# Patient Record
Sex: Female | Born: 2000 | Race: White | Hispanic: No | Marital: Single | Smoking: Never smoker
Health system: Southern US, Community
[De-identification: ages and names within clinical notes are randomized; demographics above are authoritative.]

## PROBLEM LIST (undated history)

## (undated) DIAGNOSIS — H9191 Unspecified hearing loss, right ear: Secondary | ICD-10-CM

## (undated) DIAGNOSIS — K0889 Other specified disorders of teeth and supporting structures: Secondary | ICD-10-CM

## (undated) DIAGNOSIS — F419 Anxiety disorder, unspecified: Secondary | ICD-10-CM

## (undated) DIAGNOSIS — H7290 Unspecified perforation of tympanic membrane, unspecified ear: Secondary | ICD-10-CM

## (undated) DIAGNOSIS — F32A Depression, unspecified: Secondary | ICD-10-CM

## (undated) HISTORY — DX: Anxiety disorder, unspecified: F41.9

## (undated) HISTORY — PX: TYMPANOSTOMY TUBE PLACEMENT: SHX32

## (undated) HISTORY — DX: Depression, unspecified: F32.A

---
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
## 2020-01-10 ENCOUNTER — Encounter: Payer: Self-pay | Admitting: Family Medicine

## 2020-01-10 ENCOUNTER — Ambulatory Visit (INDEPENDENT_AMBULATORY_CARE_PROVIDER_SITE_OTHER): Payer: 59 | Admitting: Family Medicine

## 2020-01-10 ENCOUNTER — Other Ambulatory Visit: Payer: Self-pay

## 2020-01-10 VITALS — BP 110/66 | HR 64 | Temp 98.3°F | Ht 64.0 in | Wt 174.6 lb

## 2020-01-10 DIAGNOSIS — H9203 Otalgia, bilateral: Secondary | ICD-10-CM

## 2020-01-10 DIAGNOSIS — F321 Major depressive disorder, single episode, moderate: Secondary | ICD-10-CM | POA: Diagnosis not present

## 2020-01-10 NOTE — Patient Instructions (Signed)
-  ears look fine. No infection/rupture. Would start flonase over the counter daily for a few weeks to see if it helps and then try to equalize pressure in your ears a few times a day.   Let me know if not getting better.   i'll email/call you when letter ready for your cat!   Good luck in school!  D.r Rogers Blocker

## 2020-01-10 NOTE — Progress Notes (Signed)
Patient: Margaret Snow MRN: [id number] DOB: 12-04-2000 PCP: Orma Flaming, MD     Subjective:  Chief Complaint  Patient presents with  . Ear Fullness    [name] ear has been stopped up for 2 weeks, since diving into the pool.    HPI: The patient is a 19 y.o. female who presents today for a ear fullness x 2 weeks. She says that she dived into the pool and her ears hurt really bad after she dove into the pool and since then her hearing has been stopped up. She states she has had decreased hearing since that time, but this has improved. Pain has dramatically improved. She has not used anything over the counter. Her left ear is worse than her right. Denies any tinnitus, drainage.    She also would like a letter for her cat as an emotional support animal at school. Moving into an apartment by herself and is worried if she doesn't have her cat she will get more depressed/lonely. Does comfort her.   Review of Systems  Constitutional: Negative for chills and fever.  HENT: Positive for ear pain. Negative for facial swelling, sore throat and tinnitus.   Neurological: Negative for dizziness, light-headedness and headaches.    Allergies Patient has No Known Allergies.  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  Surgical History Patient  has a past surgical history that includes Tympanostomy tube placement (2004); Tympanoplasty (02/13/2012); and Tympanostomy tube placement.  Family History Pateint's family history includes ADD / ADHD in her sister; Alcohol abuse in her paternal grandfather; Anxiety disorder in her maternal uncle; Cancer in her maternal grandfather and paternal grandfather; Depression in her father and maternal uncle; Headache in her maternal grandfather; Hyperlipidemia in her maternal grandfather; Hypertension in her maternal grandfather, maternal grandmother, and paternal  grandfather; Migraines in her maternal uncle.  Social History Patient  reports that she has never smoked. She has never used smokeless tobacco. She reports that she does not drink alcohol and does not use drugs.    Objective: Vitals:   01/10/20 1006  BP: 110/66  Pulse: 64  Temp: 98.3 F (36.8 C)  TempSrc: Temporal  SpO2: 99%  Weight: 174 lb 9.6 oz (79.2 kg)  Height: 5\' 4"  (1.626 m)    Body mass index is 29.97 kg/m.  Physical Exam Vitals reviewed.  Constitutional:      Appearance: Normal appearance. She is obese.  HENT:     Head: Normocephalic and atraumatic.     Right Ear: Tympanic membrane, ear canal and external ear normal.     Left Ear: Tympanic membrane, ear canal and external ear normal.     Ears:     Comments: Minimal clear air fluid bubbles bilaterally     Nose: Nose normal.     Mouth/Throat:     Mouth: Mucous membranes are moist.  Eyes:     Extraocular Movements: Extraocular movements intact.     Pupils: Pupils are equal, round, and reactive to light.  Pulmonary:     Effort: Pulmonary effort is normal.     Breath sounds: Normal breath sounds.  Neurological:     General: No focal deficit present.     Mental Status: She is alert and oriented to person, place, and time.  Psychiatric:        Mood and Affect: Mood normal.        Behavior: Behavior normal.  Assessment/plan: 1. Ear pain, bilateral Ears with normal exam and symptoms have been improving.  Reassurance given. Recommended otc flonase daily and try to equalize pressure in ears a few times a day, but no infection or abnormality seen on exam. Let me know if not getting better.   2. Depression, major, single episode, moderate ([place]) Will write letter for cat. Will send via mychart or have her come pick up.     This visit occurred during the SARS-CoV-2 public health emergency.  Safety protocols were in place, including screening questions prior to the visit, additional usage of staff PPE, and  extensive cleaning of exam room while observing appropriate contact time as indicated for disinfecting solutions.     Return if symptoms worsen or fail to improve.    Orma Flaming, MD [place]   01/10/2020

## 2020-02-09 ENCOUNTER — Other Ambulatory Visit: Payer: Self-pay | Admitting: Family Medicine

## 2020-02-09 ENCOUNTER — Other Ambulatory Visit: Payer: Self-pay

## 2020-02-09 ENCOUNTER — Encounter: Payer: Self-pay | Admitting: Family Medicine

## 2020-02-09 MED FILL — TARINA 24 FE 1-20 MG-MCG(24: 1-20 | 84 days supply | Qty: 84 | Fill #1

## 2020-02-09 MED FILL — buPROPion HCL ER (XL) 150 M: 150 | 90 days supply | Qty: 90 | Fill #0

## 2020-03-15 ENCOUNTER — Other Ambulatory Visit (HOSPITAL_COMMUNITY): Payer: Self-pay

## 2020-03-15 DIAGNOSIS — L700 Acne vulgaris: Secondary | ICD-10-CM | POA: Diagnosis not present

## 2020-03-15 DIAGNOSIS — D2262 Melanocytic nevi of left upper limb, including shoulder: Secondary | ICD-10-CM | POA: Diagnosis not present

## 2020-03-15 MED FILL — TRETINOIN 0.025 % CREA: 0.025 | 30 days supply | Qty: 20 | Fill #0

## 2020-03-15 MED FILL — CLINDAMYCIN PHOS-BENZOYL PE: 1.2-5 | 30 days supply | Qty: 45 | Fill #0

## 2020-05-10 DIAGNOSIS — Z03818 Encounter for observation for suspected exposure to other biological agents ruled out: Secondary | ICD-10-CM | POA: Diagnosis not present

## 2020-05-10 DIAGNOSIS — H66002 Acute suppurative otitis media without spontaneous rupture of ear drum, left ear: Secondary | ICD-10-CM | POA: Diagnosis not present

## 2020-05-10 DIAGNOSIS — J029 Acute pharyngitis, unspecified: Secondary | ICD-10-CM | POA: Diagnosis not present

## 2020-05-10 DIAGNOSIS — Z20822 Contact with and (suspected) exposure to covid-19: Secondary | ICD-10-CM | POA: Diagnosis not present

## 2020-06-15 DIAGNOSIS — D2262 Melanocytic nevi of left upper limb, including shoulder: Secondary | ICD-10-CM | POA: Diagnosis not present

## 2020-06-15 DIAGNOSIS — L700 Acne vulgaris: Secondary | ICD-10-CM | POA: Diagnosis not present

## 2020-07-07 ENCOUNTER — Ambulatory Visit: Payer: 59 | Admitting: Family Medicine

## 2020-07-07 ENCOUNTER — Encounter: Payer: Self-pay | Admitting: Family Medicine

## 2020-07-07 ENCOUNTER — Other Ambulatory Visit: Payer: Self-pay

## 2020-07-07 VITALS — BP 126/80 | HR 88 | Temp 98.3°F | Ht 64.0 in | Wt 177.4 lb

## 2020-07-07 DIAGNOSIS — F411 Generalized anxiety disorder: Secondary | ICD-10-CM | POA: Diagnosis not present

## 2020-07-07 DIAGNOSIS — F321 Major depressive disorder, single episode, moderate: Secondary | ICD-10-CM

## 2020-07-07 MED ORDER — BUSPIRONE HCL 7.5 MG PO TABS: 7.5000 mg | ORAL_TABLET | Freq: Three times a day (TID) | ORAL | 1 refills | Status: DC

## 2020-07-07 MED ORDER — BUPROPION HCL ER (XL) 300 MG PO TB24: 300.0000 mg | ORAL_TABLET | Freq: Every day | ORAL | 1 refills | Status: DC

## 2020-07-07 NOTE — Patient Instructions (Signed)
1) increasing wellbutrin to 300mg /day. Still just take once/day. My goal is to improve your depression, but watch your anxiety as this can make anxiety worse.   2) for your anxiety we are going to start a drug called buspar. This is marketed just for anxiety. You will take this twice to three times a day. Max dosage is 30mg /day. I have sent in 7.5mg  pills. I would start off taking this twice a day. You may find you need 35m in the AM and only 7.5g in the evening, but do not exceed 30mg /day.    Really work on increasing exercise. Cardio for 30 minutes that you can not talk.  Increase counseling to weekly  I would also look into a light to have in your apartment as you may have a big of seasonal affective disorder, which can increase depression due to low sunlight in winter months.

## 2020-07-07 NOTE — Progress Notes (Signed)
Patient: Margaret Snow MRN: [id number] DOB: 2000/07/30 PCP: Orma Flaming, MD     Subjective:  Chief Complaint  Patient presents with  . Depression  . Anxiety    HPI: The patient is a 20 y.o. female who presents today for depression. Pt says she is still doing good on Wellbutrin. I had her on prozac and she was not controlled. She wanted a trial of wellbutrin and she has been on 150mg . She states she can tell a difference on medication, but is still tired and sleeps a lot. She is still dating the same guy and this is going well. She does feel like her anxiety is worse and it started when she moved back up to [place] last August. Anything can trigger her anxiety. She states it starts off as something minor and then snowballs and she can't stop it. She did start seeing a therapist recently. She is seeing them through a telehealth provider and she isn't sure if this is helping. Right now it's once every 2-3 weeks. Also had to move her cat back home which really helped her emotionally.   She feels like her anxiety is worse. She walks on treadmill or does a video work out. She is not having panic attacks, but does get close to having anxiety attacks. Has not really been on much for anxiety, just the prozac.   No si/hi/ah/vh  Review of Systems  Constitutional: Negative for chills, fatigue and fever.  HENT: Negative for dental problem, ear pain, hearing loss and trouble swallowing.   Eyes: Negative for visual disturbance.  Respiratory: Negative for cough, chest tightness and shortness of breath.   Cardiovascular: Negative for chest pain, palpitations and leg swelling.  Gastrointestinal: Negative for abdominal pain, blood in stool, diarrhea and nausea.  Endocrine: Negative for cold intolerance, polydipsia, polyphagia and polyuria.  Genitourinary: Negative for dysuria and hematuria.  Musculoskeletal: Negative for arthralgias.  Skin: Negative for rash.  Neurological: Negative for dizziness and  headaches.  Psychiatric/Behavioral: Negative for dysphoric mood, sleep disturbance and suicidal ideas. The patient is not nervous/anxious.     Allergies Patient has No Known Allergies.  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  Surgical History Patient  has a past surgical history that includes Tympanostomy tube placement (2004); Tympanoplasty (02/13/2012); and Tympanostomy tube placement.  Family History Pateint's family history includes ADD / ADHD in her sister; Alcohol abuse in her paternal grandfather; Anxiety disorder in her maternal uncle; Cancer in her maternal grandfather and paternal grandfather; Depression in her father and maternal uncle; Headache in her maternal grandfather; Hyperlipidemia in her maternal grandfather; Hypertension in her maternal grandfather, maternal grandmother, and paternal grandfather; Migraines in her maternal uncle.  Social History Patient  reports that she has never smoked. She has never used smokeless tobacco. She reports that she does not drink alcohol and does not use drugs.    Objective: Vitals:   07/07/20 1335 07/07/20 1358  BP: 134/90 126/80  Pulse: 88   Temp: 98.3 F (36.8 C)   TempSrc: Temporal   SpO2: 98%   Weight: 177 lb 6.4 oz (80.5 kg)   Height: 5\' 4"  (1.626 m)     Body mass index is 30.45 kg/m.  Physical Exam Vitals reviewed.  Constitutional:      Appearance: Normal appearance. She is obese.  HENT:     Head: Atraumatic.  Cardiovascular:     Rate and Rhythm:  Normal rate and regular rhythm.     Heart sounds: Normal heart sounds.  Pulmonary:     Effort: Pulmonary effort is normal.     Breath sounds: Normal breath sounds.  Abdominal:     General: Abdomen is flat. Bowel sounds are normal.     Palpations: Abdomen is soft.  Musculoskeletal:     Cervical back: Normal range of motion and neck supple.  Neurological:      General: No focal deficit present.     Mental Status: She is alert and oriented to person, place, and time.  Psychiatric:        Mood and Affect: Mood normal.        Behavior: Behavior normal.    [place] Office Visit from 07/07/2020 in [facility]  PHQ-9 Total Score 18     GAD 7 : Generalized Anxiety Score 07/07/2020 06/11/2017  Nervous, Anxious, on Edge 3 1  Control/stop worrying 3 1  Worry too much - different things 3 1  Trouble relaxing 2 1  Restless 1 0  Easily annoyed or irritable 2 1  Afraid - awful might happen 3 0  Total GAD 7 Score 17 5  Anxiety Difficulty Very difficult -        Assessment/plan: 1. Depression, major, single episode, moderate (HCC) We are going to increase her wellbutrin to 300mg  to see if we can get her depression under better control although her phq9 score is about the same as last time with no significant improvement. I have discussed that wellbutirn can increase anxiety, but she wanted to try this when we changed her to it a few months ago. Discussed with increased dosage she needs to watch her anxiety and let me know if she thinks it is exacerbating this. Also want her to increase counseling to weekly and try to start exercising more. Any worsening symptoms she is to let me know. Any si/hi she is to call 911 or go to ER. Close f/u in one month.   2. GAD (generalized anxiety disorder) Trial of buspar BID-TID. Again discussed wellbutirn may make her anxiety worse so to watch this. Wrote down how to take this and to let me know if any issues. Close f/u in one month. See above.      This visit occurred during the SARS-CoV-2 public health emergency.  Safety protocols were in place, including screening questions prior to the visit, additional usage of staff PPE, and extensive cleaning of exam room while observing appropriate contact time as indicated for disinfecting solutions.      Return in about 1 month (around 08/04/2020)  for anxiety/depression. virtual okay. Orma Flaming, MD [place]   07/07/2020

## 2020-08-04 ENCOUNTER — Other Ambulatory Visit (HOSPITAL_COMMUNITY): Payer: Self-pay | Admitting: Family Medicine

## 2020-08-04 ENCOUNTER — Encounter: Payer: Self-pay | Admitting: Family Medicine

## 2020-08-04 ENCOUNTER — Telehealth (INDEPENDENT_AMBULATORY_CARE_PROVIDER_SITE_OTHER): Payer: 59 | Admitting: Family Medicine

## 2020-08-04 VITALS — Ht 64.0 in | Wt 177.5 lb

## 2020-08-04 DIAGNOSIS — F419 Anxiety disorder, unspecified: Secondary | ICD-10-CM | POA: Diagnosis not present

## 2020-08-04 DIAGNOSIS — F32A Depression, unspecified: Secondary | ICD-10-CM

## 2020-08-04 MED ORDER — NORETHIN ACE-ETH ESTRAD-FE 1-20 MG-MCG(24) PO TABS: 1.0000 | ORAL_TABLET | Freq: Every day | ORAL | 1 refills | Status: DC

## 2020-08-04 MED FILL — TARINA 24 FE 1-20 MG-MCG(24: 1-20 | 84 days supply | Qty: 84 | Fill #0

## 2020-08-04 NOTE — Progress Notes (Addendum)
Patient: MALESSA ZARTMAN MRN: [id number] DOB: January 21, 2001 PCP: Orma Flaming, MD     I connected with [name] on 08/04/20 at 1:46 pm by video enabled telemedicine application and verified that I am speaking with the correct person using two identifiers.  Location patient: Home Location provider: [facility], Office Persons participating in this virtual visit: Suan Halter and Dr. Rogers Blocker  I discussed the limitations of evaluation and management by telemedicine and the availability of in person appointments. The patient expressed understanding and agreed to proceed.   Interactive audio and video telecommunications were attempted between this provider and patient, however failed, due to patient having technical difficulties OR patient did not have access to video capability.  We continued and completed visit with audio only.    Subjective:  Chief Complaint  Patient presents with  . Anxiety  . Depression    HPI: The patient is a 20 y.o. female who presents today for Anxiety and Depression.  I saw her a month ago and we increased her wellbutrin to 300mg /day. I also started her buspar 7.5mg  TID and she really likes this and thinks it helps. Also wanted her to start exercising and watch her anxiety as wellbutrin can increase this. She states she can not find the energy or motivation to work out. She does do a spin class weekly. Its hard to balance her job as well as find time to work out. She would like to help me brainstorm ideas to help her work out more. No si/hi. No panic attacks.   She feels like her anxiety is better, but not fully controlled.   Review of Systems  Constitutional: Negative for chills, fatigue and fever.  HENT: Negative for dental problem, ear pain, hearing loss and trouble swallowing.   Eyes: Negative for visual disturbance.  Respiratory: Negative for cough, chest tightness and shortness of breath.   Cardiovascular: Negative for chest pain, palpitations and leg  swelling.  Gastrointestinal: Negative for abdominal pain, blood in stool, diarrhea and nausea.  Endocrine: Negative for cold intolerance, polydipsia, polyphagia and polyuria.  Genitourinary: Negative for dysuria and hematuria.  Musculoskeletal: Negative for arthralgias.  Skin: Negative for rash.  Neurological: Negative for dizziness and headaches.  Psychiatric/Behavioral: Negative for dysphoric mood and sleep disturbance. The patient is not nervous/anxious.     Allergies Patient has No Known Allergies.  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  Surgical History Patient  has a past surgical history that includes Tympanostomy tube placement (2004); Tympanoplasty (02/13/2012); and Tympanostomy tube placement.  Family History Pateint's family history includes ADD / ADHD in her sister; Alcohol abuse in her paternal grandfather; Anxiety disorder in her maternal uncle; Cancer in her maternal grandfather and paternal grandfather; Depression in her father and maternal uncle; Headache in her maternal grandfather; Hyperlipidemia in her maternal grandfather; Hypertension in her maternal grandfather, maternal grandmother, and paternal grandfather; Migraines in her maternal uncle.  Social History Patient  reports that she has never smoked. She has never used smokeless tobacco. She reports that she does not drink alcohol and does not use drugs.    Objective: Vitals:   08/04/20 1318  Weight: 177 lb 7.5 oz (80.5 kg)  Height: 5\' 4"  (1.626 m)    Body mass index is 30.46 kg/m.  Physical Exam     Flowsheet Row Video Visit from 08/04/2020 in [facility]  PHQ-9 Total Score 9  GAD 7 : Generalized Anxiety Score 08/04/2020 07/07/2020 06/11/2017  Nervous, Anxious, on Edge 1 3 1   Control/stop worrying 1 3 1   Worry too much - different things 1 3 1   Trouble relaxing 1 2 1    Restless 0 1 0  Easily annoyed or irritable 1 2 1   Afraid - awful might happen 1 3 0  Total GAD 7 Score 6 17 5   Anxiety Difficulty Somewhat difficult Very difficult -     Assessment/plan: 1. Anxiety and depression -GAD7 score is significantly improved. Def. Doing better. She states its still not fully controlled, but better. Discussed trying buspar at 15mg  BID if needed. Does not really have panic attacks.  -phq9 score is significantly improved as well. Continue wellbutrin 300mg /day.  -I do wonder if her lack of exercise is more interest vs. Motivation. She is sticking with her weekly cycle class. Thought of trying to find another group class that she will do weekly and then getting out and just walking if she can.  -we are going ot see how she does on increased dose of buspar and if still not where she wants to be she will let me know. Next step is psychiatry as I am out of other things to treat her with. She will email and let me know.  -continue counseling at school     I spent 20 minutes in audio call with Zeniah discussing anxiety/depression and plan of care.    Return if symptoms worsen or fail to improve.    Orma Flaming, MD [place]  08/04/2020

## 2020-08-24 ENCOUNTER — Other Ambulatory Visit (HOSPITAL_BASED_OUTPATIENT_CLINIC_OR_DEPARTMENT_OTHER): Payer: Self-pay

## 2020-09-04 DIAGNOSIS — Z03818 Encounter for observation for suspected exposure to other biological agents ruled out: Secondary | ICD-10-CM | POA: Diagnosis not present

## 2020-09-04 DIAGNOSIS — Z20822 Contact with and (suspected) exposure to covid-19: Secondary | ICD-10-CM | POA: Diagnosis not present

## 2020-09-04 DIAGNOSIS — H66003 Acute suppurative otitis media without spontaneous rupture of ear drum, bilateral: Secondary | ICD-10-CM | POA: Diagnosis not present

## 2020-09-23 IMAGING — US US AXILLARY RIGHT
1 series · 7 of 7 positions shown · non-contrast
Comparison: Baseline evaluation

CLINICAL DATA: In [REDACTED] or [place] patient noted a small RIGHT axillary
mass. Mass continued to increase in size until approximately 1-2
months ago, reaching the 3-4 centimeters and was a little
red.Patient feels that the mass is getting significantly smaller at
this time.

EXAM:
ULTRASOUND OF THE RIGHT AXILLA

[Series 1: us axillary right · 0.04mm/px · 7 of 7 slices shown]
[im 1/7]
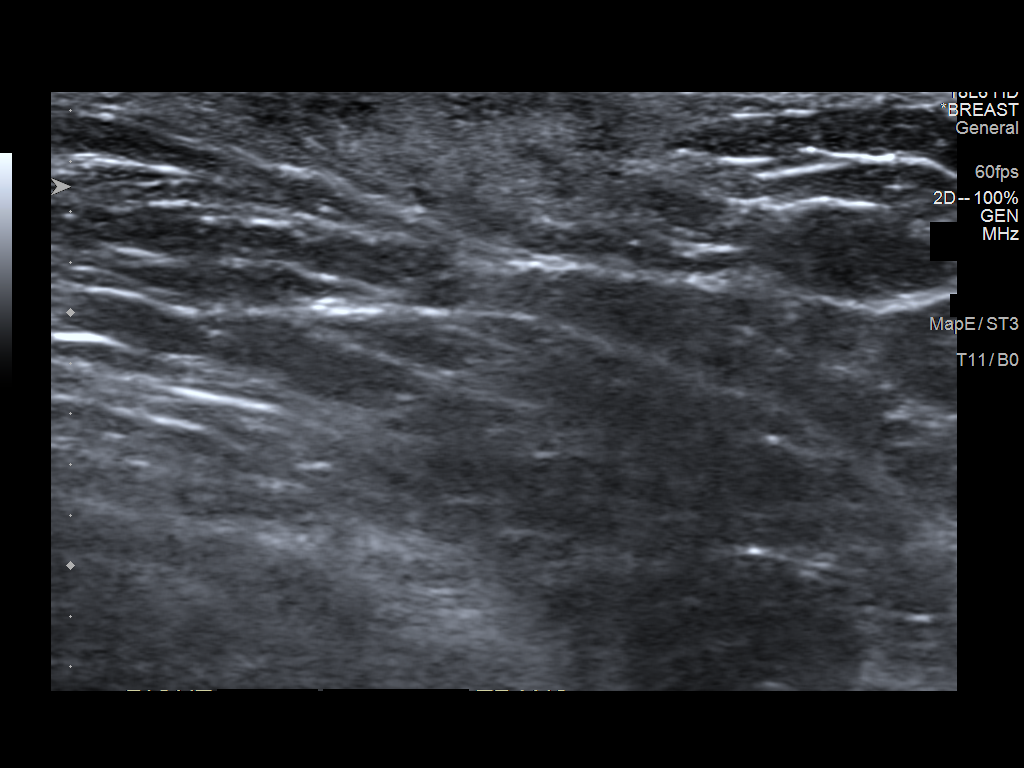
[im 2/7]
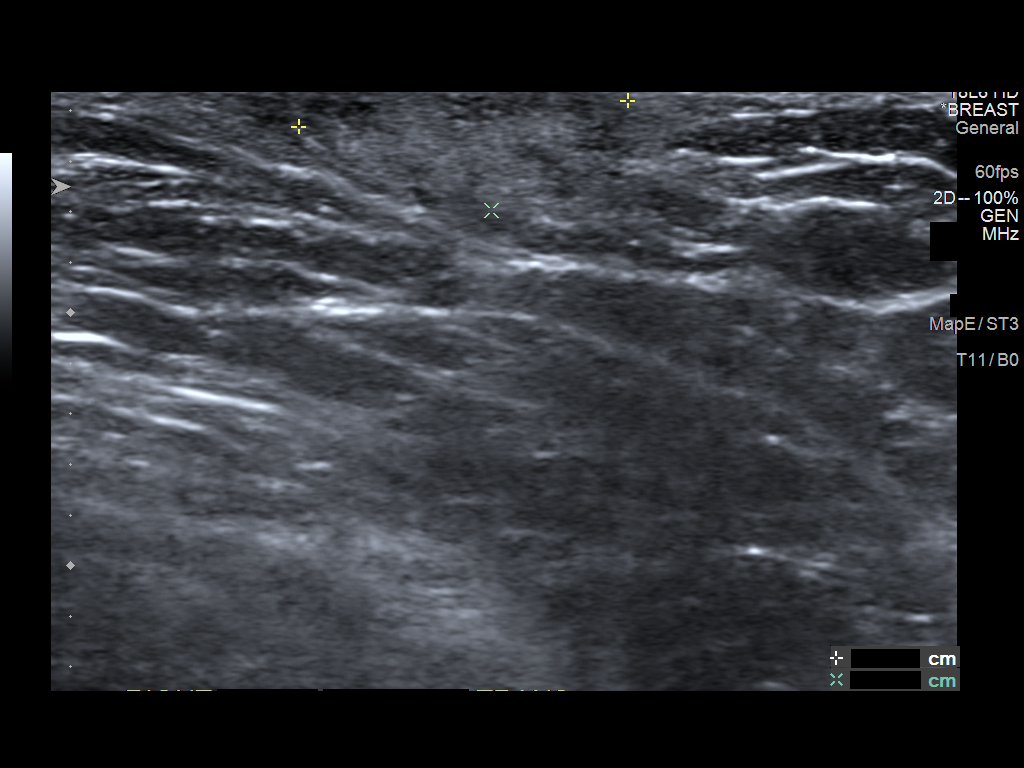
[im 3/7]
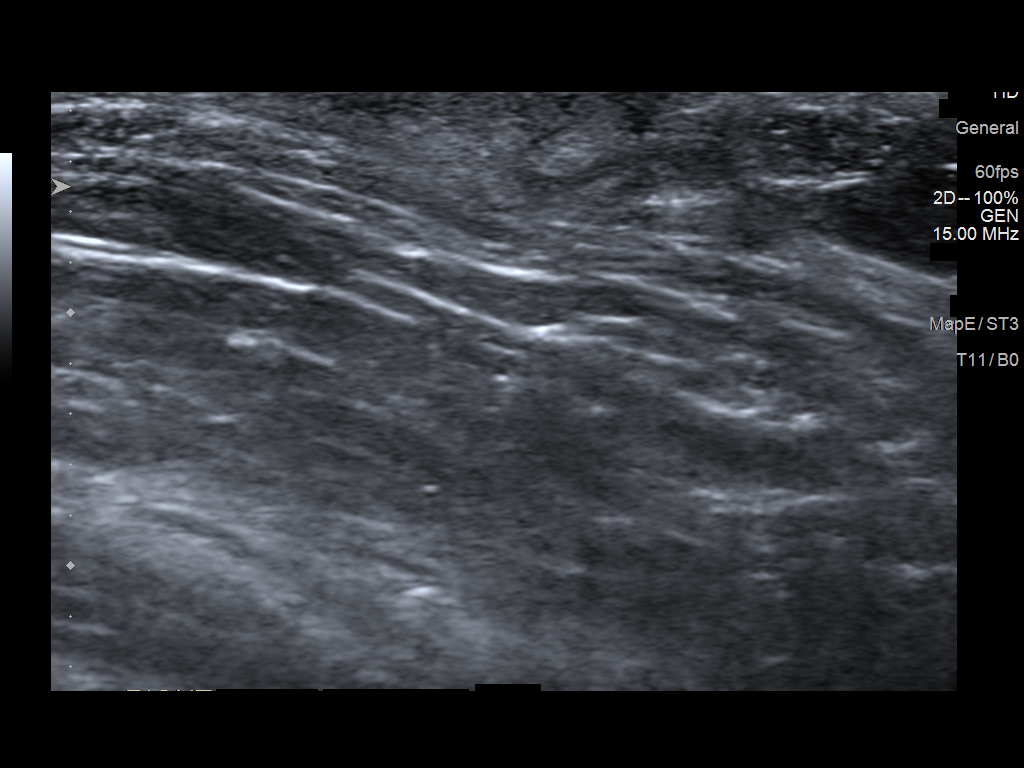
[im 4/7]
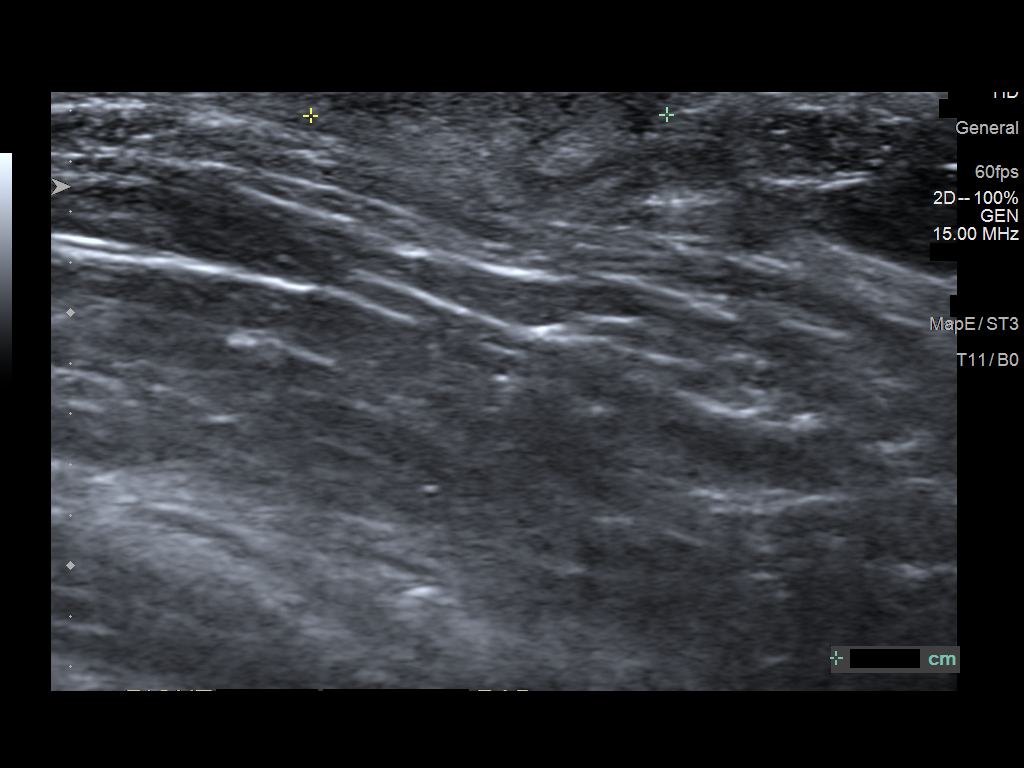
[im 5/7]
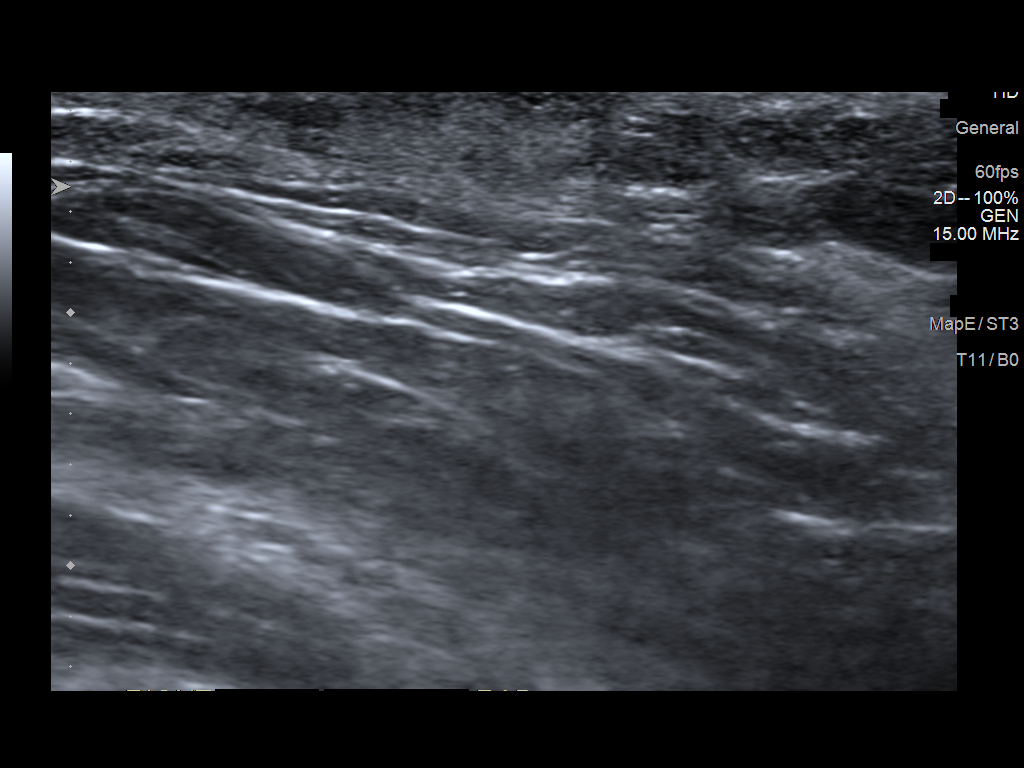
[im 6/7]
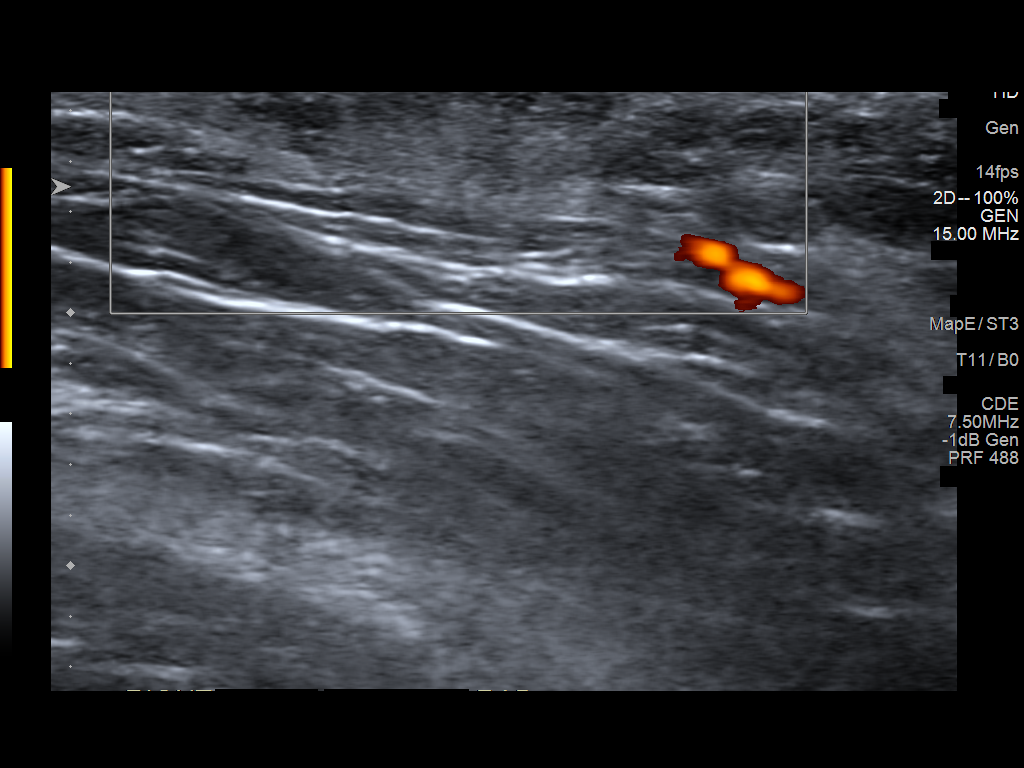
[im 7/7]
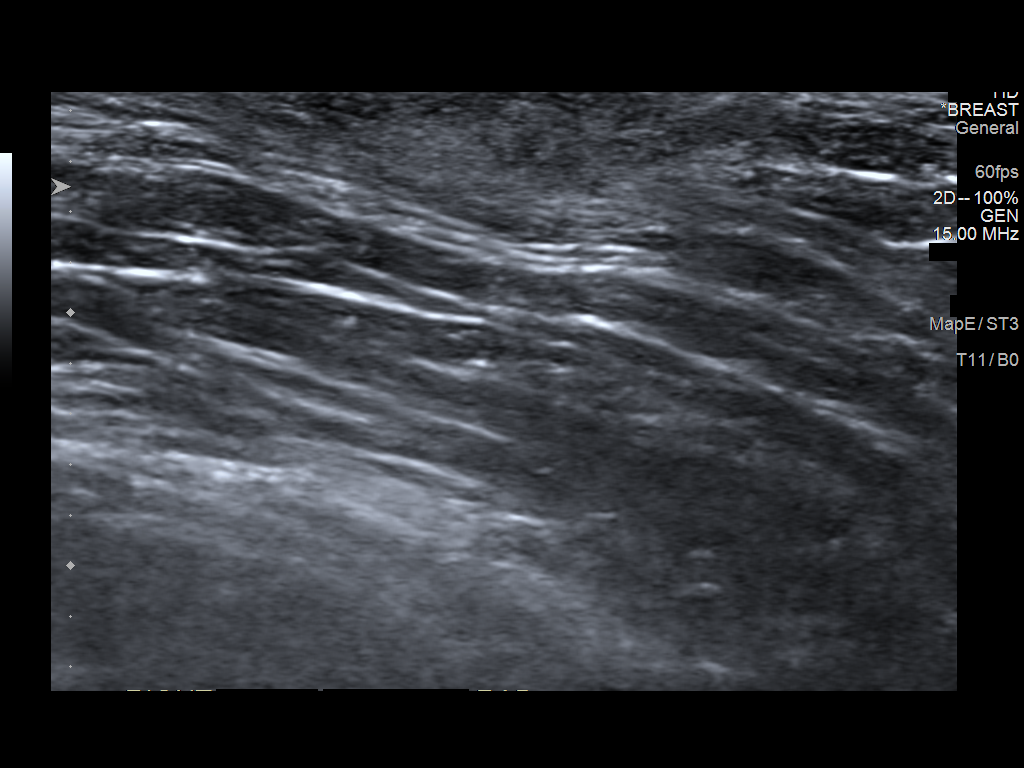

[7 of 7 positions shown; findings below may reference images not displayed]

FINDINGS: On physical exam, there is a superficial palpable 2 centimeter mass
in the UPPER RIGHT axilla, with slight overlying hyper pigmentation.
There is no erythema. No visible punctum.

Targeted ultrasound is performed, showing an irregular hyperechoic
subdermal collection in the RIGHT axilla in the area of palpable
abnormality. This area measures 1.4 x 1.3 x 0.5 centimeters.
Evaluation of the LOWER axilla shows normal lymph nodes without
suspicious morphology.
IMPRESSION: Findings are consistent with resolving, possibly ruptured
intradermal inclusion cyst. No evidence for adenopathy or
malignancy.

RECOMMENDATION:
Recommend a single follow-up RIGHT axillary ultrasound in 6 months,
earlier as needed. If lesion resolves, no further evaluation would
be needed.

I have discussed the findings and recommendations with the patient.
If applicable, a reminder letter will be sent to the patient
regarding the next appointment.

BI-RADS CATEGORY  3: Probably benign.

## 2020-10-18 ENCOUNTER — Other Ambulatory Visit: Payer: Self-pay

## 2020-10-18 ENCOUNTER — Ambulatory Visit: Payer: 59 | Admitting: Family Medicine

## 2020-10-18 ENCOUNTER — Encounter: Payer: Self-pay | Admitting: Family Medicine

## 2020-10-18 VITALS — BP 112/72 | HR 69 | Temp 98.6°F | Ht 64.0 in | Wt 189.2 lb

## 2020-10-18 DIAGNOSIS — L720 Epidermal cyst: Secondary | ICD-10-CM

## 2020-10-18 NOTE — Patient Instructions (Signed)
Epidermoid Cyst  An epidermoid cyst, also called an epidermal cyst, is a small lump under your skin. The cyst contains a substance called keratin. Do not try to pop or open the cyst yourself. What are the causes?  A blocked hair follicle.  A hair that curls and re-enters the skin instead of growing straight out of the skin.  A blocked pore.  Irritated skin.  An injury to the skin.  Certain conditions that are passed along from parent to child.  Human papillomavirus (HPV). This happens rarely when cysts occur on the bottom of the feet.  Long-term sun damage to the skin. What increases the risk?  Having acne.  Being female.  Having an injury to the skin.  Being past puberty.  Having certain conditions caused by genes (genetic disorder) What are the signs or symptoms? These cysts are usually harmless, but they can get infected. Symptoms of infection may include:  Redness.  Inflammation.  Tenderness.  Warmth.  Fever.  A bad-smelling substance that drains from the cyst.  Pus that drains from the cyst. How is this treated? In many cases, epidermoid cysts go away on their own without treatment. If a cyst becomes infected, treatment may include:  Opening and draining the cyst, done by a doctor. After draining, you may need minor surgery to remove the rest of the cyst.  Antibiotic medicine.  Shots of medicines (steroids) that help to reduce inflammation.  Surgery to remove the cyst. Surgery may be done if the cyst: ? Becomes large. ? Bothers you. ? Has a chance of turning into cancer.  Do not try to open a cyst yourself. Follow these instructions at home: Medicines  Take over-the-counter and prescription medicines as told by your doctor.  If you were prescribed an antibiotic medicine, take it as told by your doctor. Do not stop taking it even if you start to feel better. General instructions  Keep the area around your cyst clean and dry.  Wear loose, dry  clothing.  Avoid touching your cyst.  Check your cyst every day for signs of infection. Check for: ? Redness, swelling, or pain. ? Fluid or blood. ? Warmth. ? Pus or a bad smell.  Keep all follow-up visits. How is this prevented?  Wear clean, dry, clothing.  Avoid wearing tight clothing.  Keep your skin clean and dry. Take showers or baths every day. Contact a doctor if:  Your cyst has symptoms of infection.  Your condition does not improve or gets worse.  You have a cyst that looks different from other cysts you have had.  You have a fever. Get help right away if:  Redness spreads from the cyst into the area close by. Summary  An epidermoid cyst is a small lump under your skin.  If a cyst becomes infected, treatment may include surgery to open and drain the cyst, or to remove it.  Take over-the-counter and prescription medicines only as told by your doctor.  Contact a doctor if your condition is not improving or is getting worse.  Keep all follow-up visits. This information is not intended to replace advice given to you by your health care provider. Make sure you discuss any questions you have with your health care provider. Document Revised: 08/25/2019 Document Reviewed: 08/25/2019 Elsevier Patient Education  2021 Elsevier Inc.  

## 2020-10-18 NOTE — Progress Notes (Signed)
Patient: Margaret Snow MRN: [id number] DOB: [birth year] PCP: Orma Flaming, MD     Subjective:  Chief Complaint  Patient presents with  . Mass    Under right armpit. Painful to touch. Noticed about 1-2 weeks ago.    HPI: The patient is a 20 y.o. female who presents today for a bump under the right armpit. She says that it painful to touch. She first noticed about 1-2 weeks ago and it was bigger than it is now. Never was red, draining or had a head. It has gotten a little smaller. She had this last year and we ultra sounded it and was deemed an inclusion cyst. Now that's it's back and is painful she would like it removed.   Review of Systems  Constitutional: Negative for chills, fatigue and fever.  Skin: Negative for rash and wound.       Mass under right arm     Allergies Patient has No Known Allergies.  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  Surgical History Patient  has a past surgical history that includes Tympanostomy tube placement (2004); Tympanoplasty (02/13/2012); and Tympanostomy tube placement.  Family History Pateint's family history includes ADD / ADHD in her sister; Alcohol abuse in her paternal grandfather; Anxiety disorder in her maternal uncle; Cancer in her maternal grandfather and paternal grandfather; Depression in her father and maternal uncle; Headache in her maternal grandfather; Hyperlipidemia in her maternal grandfather; Hypertension in her maternal grandfather, maternal grandmother, and paternal grandfather; Migraines in her maternal uncle.  Social History Patient  reports that she has never smoked. She has never used smokeless tobacco. She reports that she does not drink alcohol and does not use drugs.    Objective: Vitals:   10/18/20 1329  BP: 112/72  Pulse: 69  Temp: 98.6 F (37 C)  TempSrc: Temporal  SpO2: 99%  Weight: 189 lb 3.2 oz  (85.8 kg)  Height: 5\' 4"  (1.626 m)    Body mass index is 32.48 kg/m.  Physical Exam Vitals reviewed.  Constitutional:      Appearance: Normal appearance. She is obese.  HENT:     Head: Normocephalic and atraumatic.     Right Ear: Tympanic membrane, ear canal and external ear normal.     Left Ear: Tympanic membrane, ear canal and external ear normal.     Nose: Nose normal.  Skin:    Findings: Lesion present.     Comments: Right axillae she has a superficial, mobile mass about 54mm in length. No erythema, drainage. Really no tenderness. No other lesions in either axillae, under breast or groin.   Neurological:     Mental Status: She is alert.        Assessment/plan: 1. Inclusion cyst Since continuing to bother her will have her get it excised. Referred to surgery.  - Ambulatory referral to General Surgery     Return if symptoms worsen or fail to improve.   Orma Flaming, MD [place]   10/18/2020

## 2020-10-24 ENCOUNTER — Encounter: Payer: Self-pay | Admitting: Family Medicine

## 2020-12-25 DIAGNOSIS — L700 Acne vulgaris: Secondary | ICD-10-CM | POA: Diagnosis not present

## 2020-12-25 DIAGNOSIS — D2262 Melanocytic nevi of left upper limb, including shoulder: Secondary | ICD-10-CM | POA: Diagnosis not present

## 2021-03-28 ENCOUNTER — Telehealth: Payer: Self-pay

## 2021-03-28 NOTE — Telephone Encounter (Signed)
LAST APPOINTMENT DATE:  10/18/20  NEXT APPOINTMENT DATE: 05/22/21 TOC from [facility]  MEDICATION:Norethindrone Acetate-Ethinyl Estrad-FE (LOESTRIN 24 FE) 1-20 MG-MCG(24) tablet  buPROPion (WELLBUTRIN XL) 300 MG 24 hr tablet  PHARMACY: [id number] - [place],  - [address] AT [facility] [address]

## 2021-03-29 ENCOUNTER — Other Ambulatory Visit: Payer: Self-pay

## 2021-03-29 MED ORDER — NORETHIN ACE-ETH ESTRAD-FE 1-20 MG-MCG(24) PO TABS: 1.0000 | ORAL_TABLET | Freq: Every day | ORAL | 0 refills | Status: DC

## 2021-03-29 MED ORDER — BUPROPION HCL ER (XL) 300 MG PO TB24: 300.0000 mg | ORAL_TABLET | Freq: Every day | ORAL | 0 refills | Status: DC

## 2021-03-29 NOTE — Telephone Encounter (Signed)
Rx sent in

## 2021-04-10 ENCOUNTER — Telehealth: Payer: Self-pay | Admitting: Physician Assistant

## 2021-04-13 NOTE — Telephone Encounter (Addendum)
Received after hours call about getting short supply of wellbutrin sent to pharmacy - I gave verbal approval for 10 tablets to be phoned in to [facility] in [place]. Advised pt contact her PCP office on Monday

## 2021-04-13 NOTE — Telephone Encounter (Signed)
Patient did not realize she was denied medication and is now out of medication.    We have scheduled a virtual appointment for Monday for med follow up.  Is there any way a few pills could be called into [facility] in [place] to get her through until Monday?  Please follow back up with mom, Mindy at [phone].

## 2021-04-16 ENCOUNTER — Other Ambulatory Visit (HOSPITAL_COMMUNITY): Payer: Self-pay

## 2021-04-16 ENCOUNTER — Telehealth (INDEPENDENT_AMBULATORY_CARE_PROVIDER_SITE_OTHER): Payer: 59 | Admitting: Physician Assistant

## 2021-04-16 ENCOUNTER — Telehealth: Payer: Self-pay

## 2021-04-16 ENCOUNTER — Encounter: Payer: Self-pay | Admitting: Physician Assistant

## 2021-04-16 DIAGNOSIS — F419 Anxiety disorder, unspecified: Secondary | ICD-10-CM

## 2021-04-16 DIAGNOSIS — F32A Depression, unspecified: Secondary | ICD-10-CM | POA: Diagnosis not present

## 2021-04-16 MED ORDER — BUSPIRONE HCL 7.5 MG PO TABS
7.5000 mg | ORAL_TABLET | Freq: Three times a day (TID) | ORAL | 0 refills | Status: DC
  Filled 2021-04-16: qty 90, 30d supply, fill #0

## 2021-04-16 MED ORDER — BUPROPION HCL ER (XL) 300 MG PO TB24
300.0000 mg | ORAL_TABLET | Freq: Every day | ORAL | 1 refills | Status: DC
  Filled 2021-04-16: qty 30, 30d supply, fill #0

## 2021-04-16 NOTE — Telephone Encounter (Signed)
Patient Scheduled appt

## 2021-04-16 NOTE — Patient Instructions (Signed)
Please start back on your medications daily.  Follow-up with Colletta [place] or Aldona Bar on 05/28/2021.

## 2021-04-16 NOTE — Progress Notes (Signed)
Virtual Visit via Video Note  I connected with  Margaret Snow  on 04/16/21 at  7:30 AM EST by a video enabled telemedicine application and verified that I am speaking with the correct person using two identifiers.  Location: Patient: home (apartment at [employer]) Provider: Therapist, music at [place] present: Patient and myself   I discussed the limitations of evaluation and management by telemedicine and the availability of in person appointments. The patient expressed understanding and agreed to proceed.   History of Present Illness:  Very pleasant 20 year old female patient is following up via virtual video visit on her history of depression and anxiety.  She states that she has been without her Wellbutrin and BuSpar for the last 30 days and she has noticed a big change in how she is doing.  She definitely feels like she has been more withdrawn and sleeping more.  She feels like she has not been doing normal activities that she enjoys.  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 Lives in an apartment with one roommate who isn't there often.  She is trying to get outside daily and walk to campus. She is working on [employer]. She enjoys her cats.  She is no longer seeing a counselor, previously was going through tele-health.   She denies any SI/HI.   Observations/Objective:   Gen: Awake, alert, no acute distress Resp: Breathing is even and non-labored Psych: calm/pleasant demeanor Neuro: Alert and Oriented x 3, + facial symmetry, speech is clear.   Assessment and Plan:  1. Anxiety and depression -Currently worse -She has been w/o medications the past 30 days and she no longer has a Social worker. -I sent patient refills on the Wellbutrin XL 300 mg once daily and BuSpar 7.5 mg 3 times daily and informed her to start back on this and do not miss doses. -I also encouraged patient to look into counselor on campus. -She is going to continue working  on natural mood boosters such as getting outside and eating right and walking daily. -I would like for patient to follow-up when she is back home on 05/28/2021.  As I will be out of the office that day, I encouraged an appointment with my colleagues either Colletta Maryland or [place] in office.  Hopefully at that time she will be doing better as she is back on the medicine. -She knows to call sooner if she has any questions or concerns. PHQ9 SCORE ONLY 04/16/2021 08/04/2020 07/07/2020  PHQ-9 Total Score 16 9 18    GAD 7 : Generalized Anxiety Score 04/16/2021 08/04/2020 07/07/2020 06/11/2017  Nervous, Anxious, on Edge 1 1 3 1   Control/stop worrying 2 1 3 1   Worry too much - different things 2 1 3 1   Trouble relaxing 3 1 2 1   Restless 1 0 1 0  Easily annoyed or irritable 1 1 2 1   Afraid - awful might happen 2 1 3  0  Total GAD 7 Score 12 6 17 5   Anxiety Difficulty Very difficult Somewhat difficult Very difficult -     Follow Up Instructions:    I discussed the assessment and treatment plan with the patient. The patient was provided an opportunity to ask questions and all were answered. The patient agreed with the plan and demonstrated an understanding of the instructions.   The patient was advised to call back or seek an in-person evaluation if the symptoms worsen or if the condition fails to improve as anticipated.  Time Spent: 31  minutes of total time was spent on the date of the encounter performing the following actions: chart review prior to seeing the patient, obtaining history, performing a medically necessary exam, counseling on the treatment plan, placing orders, and documenting in our EHR.    Jatoria Kneeland M Katalea Ucci, PA-C

## 2021-04-16 NOTE — Telephone Encounter (Signed)
Nurse Assessment Nurse: Clydene Laming, RN, Yancey Flemings Date/Time (Eastern Time): 04/13/2021 8:57:20 PM Confirm and document reason for call. If symptomatic, describe symptoms. ---Caller states dtr suffers from depression and anxiety. She needs refill. The refill was denied and they do not know why. No one told her it was denied. Caller is upset due to expecting a call back from the office and no one called. Wellbutrin --[facility] in [place]  Does the patient have any new or worsening symptoms? ---Yes Will a triage be completed? ---No Select reason for no triage. ---Other Please document clinical information provided and ist any resource used. ---Caller said that his wife had spoken to the office earlier. A PA was supposed to send in a refill of daughter's Wellbutrin to [facility], but it was not Received  Disp. Time Eilene Ghazi Time) Disposition Final User 04/13/2021 9:04:49 PM Paged On Call back to [facility], [place], Yancey Flemings  04/13/2021 9:27:15 PM Pharmacy Call Clydene Laming, RN, Yancey Flemings Reason: voicemail left at [facility] [phone] for Wellbutrin XL 300mg  PO daily 10 pills, no refills 04/13/2021 9:30:02 PM Clinical Call Yes Clydene Laming, RN, Yancey Flemings    Verbal Orders/Maintenance Medications  wellbutrin XL 300mg  Oral daily 10 Days One pill dailyl Clydene Laming, RN, Yancey Flemings   User: Lisbeth Renshaw, RN Date/Time Eilene Ghazi Time): 04/13/2021 9:28:55 PM Informed caller that 10 pills had been called and should be able to pick that up in the morning because the pharmacy closes soon. Paging Ria Bush - MD [phone] 04/13/2021 9:04:49 PM -- Paged On Call Back to Call Center // Doctor Paged Please call access nurse at [phone]. Ria Bush - MD 04/13/2021 9:21:29 PM Spoke with On Call - General Message Result Gave report. Obtained verbal order Wellbutrin XL 300mg  PO daily, quantity 10, no refills. Contact the office on Monday

## 2021-04-17 ENCOUNTER — Other Ambulatory Visit (HOSPITAL_COMMUNITY): Payer: Self-pay

## 2021-05-01 IMAGING — US US AXILLARY RIGHT
1 series · 2 of 2 positions shown · non-contrast
Comparison: 03/19/2019

CLINICAL DATA: Follow-up probably benign resolving and possibly
ruptured right axillary intradermal inclusion cyst. The patient
reports that the previously palpable lesion in the right axilla has
resolved. No family history of breast cancer.

EXAM:
ULTRASOUND OF THE RIGHT AXILLA

[Series 1: us axillary right · 0.03mm/px · 2 of 2 slices shown]
[im 1/2]
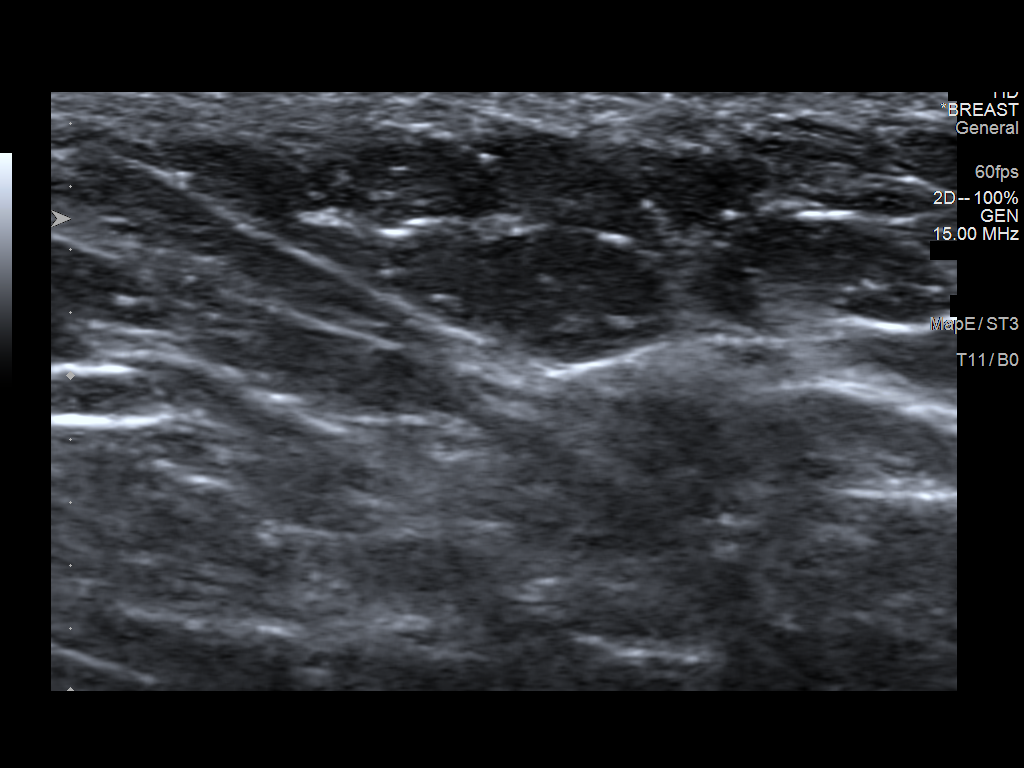
[im 2/2]
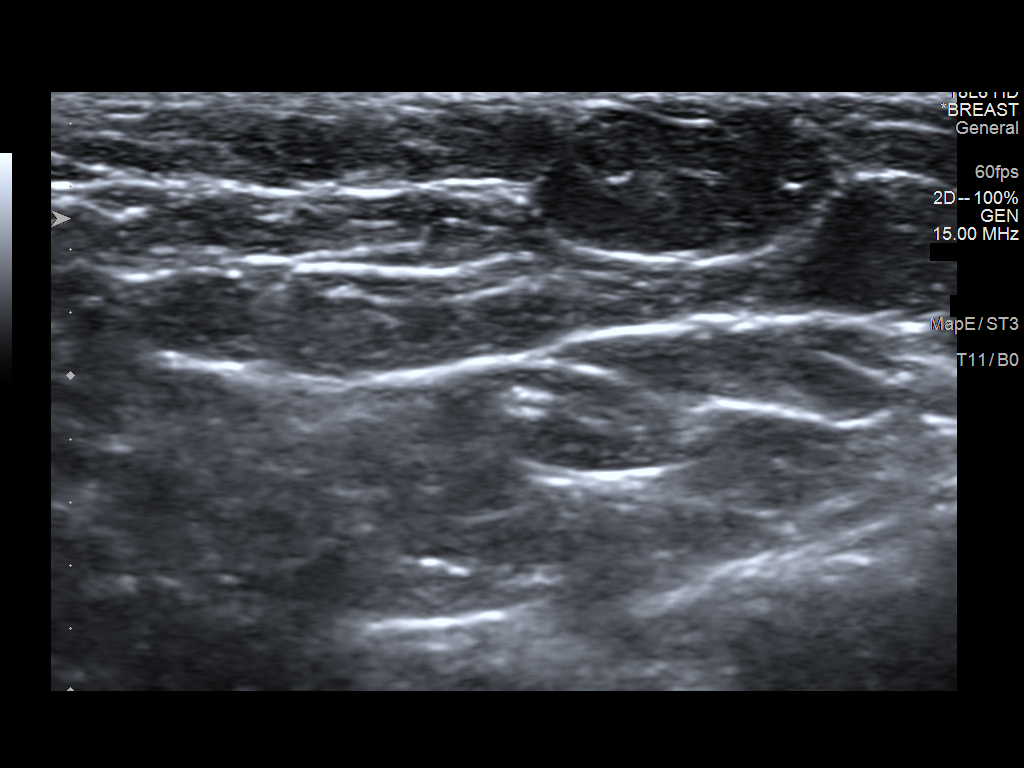

[2 of 2 positions shown; findings below may reference images not displayed]

FINDINGS: On physical exam, no mass is visible or palpable in the right
axilla.

Targeted ultrasound is performed, showing normal appearing axillary
contents. The previously demonstrated irregular, hyperechoic
subdermal collection in the right axilla is no longer visualized.
IMPRESSION: Normal examination. The previously seen right axillary abnormality
has resolved.

RECOMMENDATION:
Annual screening mammography beginning at age 40.

I have discussed the findings and recommendations with the patient.
If applicable, a reminder letter will be sent to the patient
regarding the next appointment.

BI-RADS CATEGORY  1: Negative.

## 2021-05-21 ENCOUNTER — Other Ambulatory Visit: Payer: Self-pay

## 2021-05-21 ENCOUNTER — Other Ambulatory Visit (HOSPITAL_COMMUNITY): Payer: Self-pay

## 2021-05-21 ENCOUNTER — Ambulatory Visit: Payer: 59 | Admitting: Physician Assistant

## 2021-05-21 ENCOUNTER — Encounter: Payer: Self-pay | Admitting: Physician Assistant

## 2021-05-21 VITALS — BP 109/74 | HR 69 | Temp 98.2°F | Ht 64.0 in | Wt 172.2 lb

## 2021-05-21 DIAGNOSIS — F32A Depression, unspecified: Secondary | ICD-10-CM | POA: Diagnosis not present

## 2021-05-21 DIAGNOSIS — F419 Anxiety disorder, unspecified: Secondary | ICD-10-CM | POA: Diagnosis not present

## 2021-05-21 MED ORDER — BUSPIRONE HCL 7.5 MG PO TABS
7.5000 mg | ORAL_TABLET | Freq: Three times a day (TID) | ORAL | 1 refills | Status: AC
  Filled 2021-05-21: qty 270, 90d supply, fill #0

## 2021-05-21 MED ORDER — BUPROPION HCL ER (XL) 300 MG PO TB24
300.0000 mg | ORAL_TABLET | Freq: Every day | ORAL | 1 refills | Status: DC
  Filled 2021-05-21: qty 90, 90d supply, fill #0

## 2021-05-21 MED ORDER — BUPROPION HCL ER (XL) 300 MG PO TB24
300.0000 mg | ORAL_TABLET | Freq: Every day | ORAL | 1 refills | Status: DC
  Filled 2021-05-21: qty 90, 90d supply, fill #0
  Filled 2021-09-04: qty 90, 90d supply, fill #1

## 2021-05-21 NOTE — Progress Notes (Signed)
Subjective:    Patient ID: Margaret Snow, female    DOB: [birth year], 20 y.o.   MRN: [id number]  Chief Complaint  Patient presents with   Follow-up    HPI Patient is in today for recheck from 04/16/21. She has been taking her Wellbutrin and Buspar on schedule again and feels like she is doing better. She has finished her final exams for the semester and says she did well. She is home visiting family for a few days. She has no other concerns today.   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    Past Surgical History:  Procedure Laterality Date   TYMPANOPLASTY  02/13/2012   Procedure: TYMPANOPLASTY;  Surgeon: Melissa Montane, MD;  Location: [place];  Service: ENT;  Laterality: Right;  right tympanoplasty   TYMPANOSTOMY TUBE PLACEMENT  2004   TYMPANOSTOMY TUBE PLACEMENT      Family History  Problem Relation Age of Onset   Hypertension Maternal Grandmother    Hypertension Maternal Grandfather    Headache Maternal Grandfather    Cancer Maternal Grandfather    Hyperlipidemia Maternal Grandfather    Hypertension Paternal Grandfather    Alcohol abuse Paternal Grandfather    Cancer Paternal Grandfather    Depression Father    Migraines Maternal Uncle    Depression Maternal Uncle    Anxiety disorder Maternal Uncle    ADD / ADHD Sister    Seizures Neg Hx    Bipolar disorder Neg Hx    Schizophrenia Neg Hx    Autism Neg Hx     Social History   Tobacco Use   Smoking status: Never   Smokeless tobacco: Never  Vaping Use   Vaping Use: Never used  Substance Use Topics   Alcohol use: No   Drug use: No     No Known Allergies  Review of Systems NEGATIVE UNLESS OTHERWISE INDICATED IN HPI      Objective:     BP 109/74    Pulse 69    Temp 98.2 F (36.8 C)    Ht 5\' 4"  (1.626 m)    Wt 172 lb 3.2 oz (78.1 kg)    SpO2 99%    BMI 29.56 kg/m    Wt Readings from Last 3 Encounters:  05/21/21 172 lb 3.2 oz (78.1 kg)  10/18/20 189 lb 3.2 oz (85.8 kg) (96 %, Z= 1.77)*  08/04/20 177 lb 7.5 oz (80.5 kg) (94 %, Z= 1.55)*   * Growth percentiles are based on CDC (Girls, 2-20 Years) data.    BP Readings from Last 3 Encounters:  05/21/21 109/74  10/18/20 112/72  07/07/20 126/80     Physical Exam Vitals and nursing note reviewed.  Constitutional:      Appearance: Normal appearance. She is normal weight. She is not toxic-appearing.  HENT:     Head: Normocephalic and atraumatic.     Right Ear: External ear normal.     Left Ear: External ear normal.     Nose: Nose normal.     Mouth/Throat:     Mouth: Mucous membranes are moist.  Eyes:     Extraocular Movements: Extraocular movements intact.     Conjunctiva/sclera: Conjunctivae normal.     Pupils: Pupils are equal, round, and reactive to light.  Cardiovascular:     Rate  and Rhythm: Normal rate and regular rhythm.     Pulses: Normal pulses.     Heart sounds: Normal heart sounds.  Pulmonary:     Effort: Pulmonary effort is normal.     Breath sounds: Normal breath sounds.  Musculoskeletal:        General: Normal range of motion.     Cervical back: Normal range of motion and neck supple.  Skin:    General: Skin is warm and dry.  Neurological:     General: No focal deficit present.     Mental Status: She is alert and oriented to person, place, and time.  Psychiatric:        Mood and Affect: Mood normal.        Behavior: Behavior normal.        Thought Content: Thought content normal.        Judgment: Judgment normal.       Assessment & Plan:   Problem List Items Addressed This Visit   None Visit Diagnoses     Anxiety and depression    -  Primary   Relevant Medications   busPIRone (BUSPAR) 7.5 MG tablet   buPROPion (WELLBUTRIN XL) 300 MG 24 hr tablet        Meds ordered this encounter  Medications   DISCONTD: buPROPion (WELLBUTRIN XL) 300 MG 24 hr tablet     Sig: Take 1 tablet by mouth daily.    Dispense:  90 tablet    Refill:  1   busPIRone (BUSPAR) 7.5 MG tablet    Sig: Take 1 tablet by mouth 3 times daily.    Dispense:  270 tablet    Refill:  1   buPROPion (WELLBUTRIN XL) 300 MG 24 hr tablet    Sig: Take 1 tablet by mouth daily.    Dispense:  90 tablet    Refill:  1    1. Anxiety and depression PHQ9 SCORE ONLY 05/21/2021 04/16/2021 08/04/2020  PHQ-9 Total Score 11 16 9   -Improving -Refilled Wellbutrin XL 300 mg and Buspar 7.5 mg TID, continue on these -Encouraged to seek counselor -Encouraged healthy lifestyle -F/up in 6 months, or prn.    Marquest Gunkel M Haim Hansson, PA-C

## 2021-05-21 NOTE — Patient Instructions (Signed)
Good to see you in person today. I sent refills of your medications. Continue to work on healthy lifestyle habits. Try to connect with a counselor on campus. See you back in 6 months, sooner if any concerns.

## 2021-05-22 ENCOUNTER — Other Ambulatory Visit (HOSPITAL_COMMUNITY): Payer: Self-pay

## 2021-05-22 ENCOUNTER — Encounter: Payer: 59 | Admitting: Physician Assistant

## 2021-06-16 DIAGNOSIS — J0140 Acute pansinusitis, unspecified: Secondary | ICD-10-CM | POA: Diagnosis not present

## 2021-06-27 ENCOUNTER — Other Ambulatory Visit (HOSPITAL_COMMUNITY): Payer: Self-pay

## 2021-06-27 ENCOUNTER — Other Ambulatory Visit: Payer: Self-pay | Admitting: Physician Assistant

## 2021-06-27 DIAGNOSIS — L700 Acne vulgaris: Secondary | ICD-10-CM | POA: Diagnosis not present

## 2021-06-27 MED ORDER — SPIRONOLACTONE 50 MG PO TABS
ORAL_TABLET | ORAL | 5 refills | Status: DC
  Filled 2021-06-27: qty 30, 30d supply, fill #0

## 2021-06-27 MED ORDER — CLINDAMYCIN PHOS-BENZOYL PEROX 1.2-5 % EX GEL
CUTANEOUS | 5 refills | Status: DC
  Filled 2021-06-27: qty 45, 30d supply, fill #0

## 2021-06-27 MED ORDER — NORETHIN ACE-ETH ESTRAD-FE 1-20 MG-MCG(24) PO TABS
1.0000 | ORAL_TABLET | Freq: Every day | ORAL | 0 refills | Status: DC
  Filled 2021-06-27: qty 84, 84d supply, fill #0

## 2021-07-05 ENCOUNTER — Encounter: Payer: 59 | Admitting: Physician Assistant

## 2021-07-06 ENCOUNTER — Encounter: Payer: Self-pay | Admitting: Physician Assistant

## 2021-07-06 ENCOUNTER — Other Ambulatory Visit: Payer: Self-pay

## 2021-07-06 ENCOUNTER — Ambulatory Visit (INDEPENDENT_AMBULATORY_CARE_PROVIDER_SITE_OTHER): Payer: 59 | Admitting: Physician Assistant

## 2021-07-06 VITALS — BP 117/77 | HR 75 | Temp 98.0°F | Ht 64.0 in | Wt 167.2 lb

## 2021-07-06 DIAGNOSIS — Z0000 Encounter for general adult medical examination without abnormal findings: Secondary | ICD-10-CM | POA: Diagnosis not present

## 2021-07-06 DIAGNOSIS — Z23 Encounter for immunization: Secondary | ICD-10-CM

## 2021-07-06 NOTE — Addendum Note (Signed)
Addended by: Cranston Neighbor on: 07/06/2021 08:49 AM   Modules accepted: Orders

## 2021-07-06 NOTE — Progress Notes (Signed)
Subjective:    Patient ID: Margaret Snow, female    DOB: [birth year], 21 y.o.   MRN: [id number]  Chief Complaint  Patient presents with   Annual Exam    HPI Patient is in today for annual exam.  Acute concerns: No health concerns. Needs form completed for [employer]. 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    Past Surgical History:  Procedure Laterality Date   TYMPANOPLASTY  02/13/2012   Procedure: TYMPANOPLASTY;  Surgeon: Melissa Montane, MD;  Location: [place];  Service: ENT;  Laterality: Right;  right tympanoplasty   TYMPANOSTOMY TUBE PLACEMENT  2004   TYMPANOSTOMY TUBE PLACEMENT      Family History  Problem Relation Age of Onset   Hypertension Maternal Grandmother    Hypertension Maternal Grandfather    Headache Maternal Grandfather    Cancer Maternal Grandfather    Hyperlipidemia Maternal Grandfather    Hypertension Paternal Grandfather    Alcohol abuse Paternal Grandfather    Cancer Paternal Grandfather    Depression Father    Migraines Maternal Uncle    Depression Maternal Uncle    Anxiety disorder Maternal Uncle    ADD / ADHD Sister    Seizures Neg Hx    Bipolar disorder Neg Hx    Schizophrenia Neg Hx    Autism Neg Hx     Social History   Tobacco Use   Smoking status: Never   Smokeless tobacco: Never  Vaping Use   Vaping Use: Never used  Substance Use Topics   Alcohol use:  No   Drug use: No     No Known Allergies  Review of Systems NEGATIVE UNLESS OTHERWISE INDICATED IN HPI      Objective:     BP 117/77    Pulse 75    Temp 98 F (36.7 C)    Ht 5\' 4"  (1.626 m)    Wt 167 lb 3.2 oz (75.8 kg)    LMP 06/25/2021 (Approximate)    SpO2 99%    BMI 28.70 kg/m   Wt Readings from Last 3 Encounters:  07/06/21 167 lb 3.2 oz (75.8 kg)  05/21/21 172 lb 3.2 oz (78.1 kg)  10/18/20 189 lb 3.2 oz (85.8 kg) (96 %, Z= 1.77)*   * Growth percentiles are based on CDC (Girls, 2-20 Years) data.    BP Readings from Last 3 Encounters:  07/06/21 117/77  05/21/21 109/74  10/18/20 112/72     Physical Exam Vitals and nursing note reviewed.  Constitutional:      Appearance: Normal appearance. She is normal weight. She is not toxic-appearing.  HENT:     Head: Normocephalic and atraumatic.     Right Ear: Tympanic membrane, ear canal and external ear normal.     Left Ear: Tympanic membrane, ear canal and external ear normal.     Nose: Nose normal.  Mouth/Throat:     Mouth: Mucous membranes are moist.  Eyes:     Extraocular Movements: Extraocular movements intact.     Conjunctiva/sclera: Conjunctivae normal.     Pupils: Pupils are equal, round, and reactive to light.  Cardiovascular:     Rate and Rhythm: Normal rate and regular rhythm.     Pulses: Normal pulses.     Heart sounds: Normal heart sounds.  Pulmonary:     Effort: Pulmonary effort is normal.     Breath sounds: Normal breath sounds.  Abdominal:     General: Abdomen is flat. Bowel sounds are normal.     Palpations: Abdomen is soft.     Tenderness: There is no right CVA tenderness or left CVA tenderness.  Musculoskeletal:        General: Normal range of motion.     Cervical back: Normal range of motion and neck supple.  Skin:    General: Skin is warm and dry.  Neurological:     General: No focal deficit present.     Mental Status: She is alert and oriented to person, place, and time.  Psychiatric:         Mood and Affect: Mood normal.        Behavior: Behavior normal.        Thought Content: Thought content normal.        Judgment: Judgment normal.       Assessment & Plan:   Problem List Items Addressed This Visit   None Visit Diagnoses     Encounter for annual physical exam    -  Primary       Plan: -Age-appropriate screening and counseling performed today.  Preventive measures discussed and printed in AVS for patient. Tdap updated in office today. -Form completed for [employer] - healthy and able to work with children w/o limitations. -Doing well on current medication regimen.  Plan to f/up in 1 year or prn   Mang Hazelrigg M Latham Kinzler, PA-C

## 2021-07-06 NOTE — Patient Instructions (Signed)
Good to see you today! Keep up the good work with health goals. Your tetanus shot was updated today.  Call sooner if any concerns.

## 2021-09-04 ENCOUNTER — Other Ambulatory Visit (HOSPITAL_COMMUNITY): Payer: Self-pay

## 2021-09-27 ENCOUNTER — Ambulatory Visit: Payer: 59 | Admitting: Physician Assistant

## 2021-09-27 ENCOUNTER — Telehealth: Payer: 59 | Admitting: Physician Assistant

## 2021-12-11 ENCOUNTER — Other Ambulatory Visit: Payer: Self-pay | Admitting: Physician Assistant

## 2021-12-11 ENCOUNTER — Other Ambulatory Visit (HOSPITAL_COMMUNITY): Payer: Self-pay

## 2021-12-11 MED ORDER — BUPROPION HCL ER (XL) 300 MG PO TB24
300.0000 mg | ORAL_TABLET | Freq: Every day | ORAL | 0 refills | Status: DC
  Filled 2021-12-11: qty 30, 30d supply, fill #0

## 2021-12-18 ENCOUNTER — Other Ambulatory Visit: Payer: Self-pay

## 2021-12-18 ENCOUNTER — Telehealth: Payer: Self-pay | Admitting: Physician Assistant

## 2021-12-18 MED ORDER — BUPROPION HCL ER (XL) 300 MG PO TB24: 300.0000 mg | ORAL_TABLET | Freq: Every day | ORAL | 0 refills | Status: DC

## 2021-12-18 NOTE — Telephone Encounter (Signed)
Medication sent to pharmacy  

## 2021-12-18 NOTE — Telephone Encounter (Signed)
Patient states she is out of town and her RX for the medication listed below was sent to her home address (Patient states she requested [facility] send to address where Patient is at but they sent it to her home address. Patient requests the following:  .Marland Kitchen Encourage patient to contact the pharmacy for refills or they can request refills through [place]:  Please schedule appointment if longer than 1 year  07/06/21  NEXT APPOINTMENT DATE: 01/02/22  MEDICATION: buPROPion (WELLBUTRIN XL) 300 MG 24 hr tablet  Is the patient out of medication? Yes  PHARMACY: [facility] located at [place], [place], [place] Ph# [phone] (Patient does not have Fax#)  Let patient know to contact pharmacy at the end of the day to make sure medication is ready.  Please notify patient to allow 48-72 hours to process

## 2022-01-02 ENCOUNTER — Ambulatory Visit: Payer: 59 | Admitting: Physician Assistant

## 2022-01-02 ENCOUNTER — Encounter: Payer: Self-pay | Admitting: Physician Assistant

## 2022-01-02 VITALS — BP 117/76 | HR 59 | Temp 97.8°F | Ht 64.0 in | Wt 164.2 lb

## 2022-01-02 DIAGNOSIS — F32A Depression, unspecified: Secondary | ICD-10-CM

## 2022-01-02 DIAGNOSIS — R5383 Other fatigue: Secondary | ICD-10-CM

## 2022-01-02 DIAGNOSIS — F419 Anxiety disorder, unspecified: Secondary | ICD-10-CM | POA: Diagnosis not present

## 2022-01-02 LAB — COMPREHENSIVE METABOLIC PANEL
ALT: 19 U/L (ref 0–35)
AST: 17 U/L (ref 0–37)
Albumin: 4.6 g/dL (ref 3.5–5.2)
Alkaline Phosphatase: 77 U/L (ref 39–117)
BUN: 11 mg/dL (ref 6–23)
CO2: 25 mEq/L (ref 19–32)
Calcium: 9.8 mg/dL (ref 8.4–10.5)
Chloride: 106 mEq/L (ref 96–112)
Creatinine, Ser: 0.82 mg/dL (ref 0.40–1.20)
GFR: 102.56 mL/min (ref >60.00)
Glucose, Bld: 83 mg/dL (ref 70–99)
Potassium: 4.6 mEq/L (ref 3.5–5.1)
Sodium: 142 mEq/L (ref 135–145)
Total Bilirubin: 0.9 mg/dL (ref 0.2–1.2)
Total Protein: 7 g/dL (ref 6.0–8.3)

## 2022-01-02 LAB — CBC WITH DIFFERENTIAL/PLATELET
Basophils Absolute: 0 10*3/uL (ref 0.0–0.1)
Basophils Relative: 0.3 % (ref 0.0–3.0)
Eosinophils Absolute: 0 10*3/uL (ref 0.0–0.7)
Eosinophils Relative: 0.5 % (ref 0.0–5.0)
HCT: 38.4 % (ref 36.0–46.0)
Hemoglobin: 13.2 g/dL (ref 12.0–15.0)
Lymphocytes Relative: 37.2 % (ref 12.0–46.0)
Lymphs Abs: 1.8 10*3/uL (ref 0.7–4.0)
MCHC: 34.3 g/dL (ref 30.0–36.0)
MCV: 97.1 fl (ref 78.0–100.0)
Monocytes Absolute: 0.4 10*3/uL (ref 0.1–1.0)
Monocytes Relative: 8.7 % (ref 3.0–12.0)
Neutro Abs: 2.5 10*3/uL (ref 1.4–7.7)
Neutrophils Relative %: 53.3 % (ref 43.0–77.0)
Platelets: 270 10*3/uL (ref 150.0–400.0)
RBC: 3.96 Mil/uL (ref 3.87–5.11)
RDW: 12.7 % (ref 11.5–14.6)
WBC: 4.7 10*3/uL (ref 4.5–10.5)

## 2022-01-02 LAB — TSH: TSH: 2.67 u[IU]/mL (ref 0.35–5.50)

## 2022-01-02 LAB — VITAMIN D 25 HYDROXY (VIT D DEFICIENCY, FRACTURES): VITD: 30.64 ng/mL (ref 30.00–100.00)

## 2022-01-02 LAB — VITAMIN B12: Vitamin B-12: 261 pg/mL (ref 211–911)

## 2022-01-02 NOTE — Patient Instructions (Signed)
Labs today  Continue Wellbutrin XL 300 mg once daily Increase Buspar 7.5 mg to two to three times daily  Increase exercise - release the natural serotonin! :)   Look into in-person counseling   Consider pap smear after turning 21

## 2022-01-02 NOTE — Progress Notes (Signed)
Subjective:    Patient ID: Margaret Snow, female    DOB: 2000-11-09, 21 y.o.   MRN: [id number]  Chief Complaint  Patient presents with   Follow-up    Pt in office for follow up this morning since Feb annual CPE; pt states no concerns to discuss otherwise; Pt was advised she is due for Chlamydia screening, pt never had Pap and no longer taking birth control    HPI Patient is in today for 6 month recheck.   Lost 25 lbs in the last year. Says she has stopped eating out. Moves around a lot at her job. Looking forward to going back to school this Fall. Recently moved into apt with sister this summer.   More trouble falling asleep. Feeling more fatigued. Requesting labs.   Buspar 7.5 mg once in the mornings.  Wellbutrin XL 300 mg daily.  Looking into counseling.   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    Past Surgical History:  Procedure Laterality Date   TYMPANOPLASTY  02/13/2012   Procedure: TYMPANOPLASTY;  Surgeon: Melissa Montane, MD;  Location: [place];  Service: ENT;  Laterality: Right;  right tympanoplasty   TYMPANOSTOMY TUBE PLACEMENT  2004   TYMPANOSTOMY TUBE PLACEMENT      Family History  Problem Relation Age of Onset   Hypertension Maternal Grandmother    Hypertension Maternal Grandfather    Headache Maternal Grandfather    Cancer Maternal Grandfather    Hyperlipidemia Maternal Grandfather    Hypertension Paternal Grandfather    Alcohol abuse Paternal Grandfather    Cancer Paternal Grandfather    Depression Father    Migraines Maternal Uncle    Depression Maternal Uncle    Anxiety disorder Maternal Uncle    ADD / ADHD Sister    Seizures Neg Hx    Bipolar disorder Neg Hx    Schizophrenia Neg Hx    Autism Neg Hx     Social History   Tobacco Use   Smoking status: Never   Smokeless tobacco: Never  Vaping Use    Vaping Use: Never used  Substance Use Topics   Alcohol use: No   Drug use: No     No Known Allergies  Review of Systems NEGATIVE UNLESS OTHERWISE INDICATED IN HPI      Objective:     BP 117/76 (BP Location: Right Arm)   Pulse (!) 59   Temp 97.8 F (36.6 C) (Temporal)   Ht '5\' 4"'$  (1.626 m)   Wt 164 lb 3.2 oz (74.5 kg)   LMP 12/21/2021 (Approximate)   SpO2 98%   BMI 28.18 kg/m   Wt Readings from Last 3 Encounters:  01/02/22 164 lb 3.2 oz (74.5 kg)  07/06/21 167 lb 3.2 oz (75.8 kg)  05/21/21 172 lb 3.2 oz (78.1 kg)    BP Readings from Last 3 Encounters:  01/02/22 117/76  07/06/21 117/77  05/21/21 109/74     Physical Exam Vitals and nursing note reviewed.  Constitutional:      Appearance: Normal appearance. She is normal weight. She is not toxic-appearing.  HENT:     Head: Normocephalic and atraumatic.     Right Ear: External ear normal.     Left Ear: External ear normal.     Nose: Nose normal.     Mouth/Throat:  Mouth: Mucous membranes are moist.  Eyes:     Extraocular Movements: Extraocular movements intact.     Conjunctiva/sclera: Conjunctivae normal.     Pupils: Pupils are equal, round, and reactive to light.  Cardiovascular:     Rate and Rhythm: Normal rate and regular rhythm.     Pulses: Normal pulses.     Heart sounds: Normal heart sounds.  Pulmonary:     Effort: Pulmonary effort is normal.     Breath sounds: Normal breath sounds.  Musculoskeletal:        General: Normal range of motion.     Cervical back: Normal range of motion and neck supple.  Skin:    General: Skin is warm and dry.  Neurological:     General: No focal deficit present.     Mental Status: She is alert and oriented to person, place, and time.  Psychiatric:        Mood and Affect: Mood normal.        Behavior: Behavior normal.        Thought Content: Thought content normal.        Judgment: Judgment normal.        Assessment & Plan:   Problem List Items Addressed  This Visit   None Visit Diagnoses     Anxiety and depression    -  Primary   Other fatigue       Relevant Orders   VITAMIN D 25 Hydroxy (Vit-D Deficiency, Fractures) (Completed)   Vitamin B12 (Completed)   TSH (Completed)   Comprehensive metabolic panel (Completed)   CBC with Differential/Platelet (Completed)      1. Anxiety and depression 2. Other fatigue Labs today  Continue Wellbutrin XL 300 mg once daily Increase Buspar 7.5 mg to two to three times daily  Increase exercise - release the natural serotonin! :)   Look into in-person counseling     Consider pap smear after turning 21   Return in about 3 months (around 04/04/2022) for Well woman exam , med recheck .    Karagan Lehr M Isabella Roemmich, PA-C

## 2022-02-11 DIAGNOSIS — Z113 Encounter for screening for infections with a predominantly sexual mode of transmission: Secondary | ICD-10-CM | POA: Diagnosis not present

## 2022-02-11 DIAGNOSIS — Z309 Encounter for contraceptive management, unspecified: Secondary | ICD-10-CM | POA: Diagnosis not present

## 2022-02-11 DIAGNOSIS — Z6827 Body mass index (BMI) 27.0-27.9, adult: Secondary | ICD-10-CM | POA: Diagnosis not present

## 2022-02-11 DIAGNOSIS — Z124 Encounter for screening for malignant neoplasm of cervix: Secondary | ICD-10-CM | POA: Diagnosis not present

## 2022-02-11 DIAGNOSIS — Z01419 Encounter for gynecological examination (general) (routine) without abnormal findings: Secondary | ICD-10-CM | POA: Diagnosis not present

## 2022-02-11 LAB — HM PAP SMEAR: HM Pap smear: NEGATIVE

## 2022-02-11 LAB — RESULTS CONSOLE HPV: CHL HPV: NEGATIVE

## 2022-02-11 LAB — OB RESULTS CONSOLE GC/CHLAMYDIA: Chlamydia: NEGATIVE

## 2022-02-25 ENCOUNTER — Encounter: Payer: Self-pay | Admitting: *Deleted

## 2022-04-03 ENCOUNTER — Ambulatory Visit: Payer: 59 | Admitting: Physician Assistant

## 2022-04-03 ENCOUNTER — Encounter: Payer: Self-pay | Admitting: Physician Assistant

## 2022-04-03 VITALS — BP 115/78 | HR 88 | Temp 98.2°F | Ht 64.0 in | Wt 173.4 lb

## 2022-04-03 DIAGNOSIS — F419 Anxiety disorder, unspecified: Secondary | ICD-10-CM

## 2022-04-03 DIAGNOSIS — F32A Depression, unspecified: Secondary | ICD-10-CM | POA: Diagnosis not present

## 2022-04-03 NOTE — Progress Notes (Signed)
Subjective:    Patient ID: Margaret Snow, female    DOB: 29-Jun-2000, 21 y.o.   MRN: [id number]  Chief Complaint  Patient presents with   well woman exam    Pt in for 3 mon f/u w/ med check, also pt tates saw GYN in Sept and completed Pap Smear requesting results; no concerns to discuss;     HPI Patient is in today for recheck. Still doing well with Wellbutrin and Buspar. Graduates next month - BS in Psych. Drives to [place] twice weekly for school. Work keeping her busy. Occasional trouble with falling asleep, stays asleep til time to get up. No other concerns today.    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    Past Surgical History:  Procedure Laterality Date   TYMPANOPLASTY  02/13/2012   Procedure: TYMPANOPLASTY;  Surgeon: Melissa Montane, MD;  Location: [facility];  Service: ENT;  Laterality: Right;  right tympanoplasty   TYMPANOSTOMY TUBE PLACEMENT  2004   TYMPANOSTOMY TUBE PLACEMENT      Family History  Problem Relation Age of Onset   Hypertension Maternal Grandmother    Hypertension Maternal Grandfather    Headache Maternal Grandfather    Cancer Maternal Grandfather    Hyperlipidemia Maternal Grandfather    Hypertension Paternal Grandfather    Alcohol abuse Paternal Grandfather    Cancer Paternal Grandfather    Depression Father    Migraines Maternal Uncle    Depression Maternal Uncle    Anxiety disorder Maternal Uncle    ADD / ADHD Sister    Seizures Neg Hx    Bipolar disorder Neg Hx    Schizophrenia Neg Hx    Autism Neg Hx     Social History   Tobacco Use   Smoking status: Never   Smokeless tobacco: Never  Vaping Use   Vaping Use: Never used  Substance Use Topics   Alcohol use: No   Drug use: No     No Known Allergies  Review of Systems NEGATIVE UNLESS OTHERWISE INDICATED IN HPI      Objective:     BP  115/78   Pulse 88   Temp 98.2 F (36.8 C) (Temporal)   Ht '5\' 4"'$  (1.626 m)   Wt 173 lb 6.4 oz (78.7 kg)   LMP 03/17/2022 (Approximate)   SpO2 100%   BMI 29.76 kg/m   Wt Readings from Last 3 Encounters:  04/03/22 173 lb 6.4 oz (78.7 kg)  01/02/22 164 lb 3.2 oz (74.5 kg)  07/06/21 167 lb 3.2 oz (75.8 kg)    BP Readings from Last 3 Encounters:  04/03/22 115/78  01/02/22 117/76  07/06/21 117/77     Physical Exam Constitutional:      Appearance: Normal appearance.  Cardiovascular:     Rate and Rhythm: Normal rate and regular rhythm.     Pulses: Normal pulses.     Heart sounds: Normal heart sounds.  Pulmonary:     Effort: Pulmonary effort is normal.     Breath sounds: Normal breath sounds.  Neurological:     General: No focal deficit present.     Mental Status: She is alert.  Psychiatric:        Mood and Affect: Mood normal.        Behavior: Behavior normal.  Thought Content: Thought content normal.        Assessment & Plan:  Anxiety and depression Assessment & Plan: Stable, doing well with Wellbutrin XL 300 mg daily and Buspar 7.5 mg BID  Almost done with her degree  She will reach out if any concerns        Return in about 6 months (around 10/02/2022) for recheck / med check .      Kallen Mccrystal M Abdulkarim Eberlin, PA-C

## 2022-04-03 NOTE — Assessment & Plan Note (Signed)
Stable, doing well with Wellbutrin XL 300 mg daily and Buspar 7.5 mg BID  Almost done with her degree  She will reach out if any concerns

## 2022-04-08 ENCOUNTER — Encounter: Payer: Self-pay | Admitting: Physician Assistant

## 2022-06-02 DIAGNOSIS — Z111 Encounter for screening for respiratory tuberculosis: Secondary | ICD-10-CM | POA: Diagnosis not present

## 2022-07-11 ENCOUNTER — Encounter: Payer: Self-pay | Admitting: Physician Assistant

## 2022-07-11 ENCOUNTER — Ambulatory Visit: Payer: Commercial Managed Care - PPO | Admitting: Physician Assistant

## 2022-07-11 VITALS — BP 136/82 | HR 101 | Temp 97.5°F | Resp 17 | Ht 63.0 in | Wt 173.4 lb

## 2022-07-11 DIAGNOSIS — R5383 Other fatigue: Secondary | ICD-10-CM | POA: Diagnosis not present

## 2022-07-11 DIAGNOSIS — R0602 Shortness of breath: Secondary | ICD-10-CM

## 2022-07-11 LAB — CBC WITH DIFFERENTIAL/PLATELET
Basophils Absolute: 0 10*3/uL (ref 0.0–0.1)
Basophils Relative: 0.4 % (ref 0.0–3.0)
Eosinophils Absolute: 0 10*3/uL (ref 0.0–0.7)
Eosinophils Relative: 0.5 % (ref 0.0–5.0)
HCT: 39.8 % (ref 36.0–46.0)
Hemoglobin: 13.9 g/dL (ref 12.0–15.0)
Lymphocytes Relative: 31.2 % (ref 12.0–46.0)
Lymphs Abs: 2.7 10*3/uL (ref 0.7–4.0)
MCHC: 34.8 g/dL (ref 30.0–36.0)
MCV: 96.5 fl (ref 78.0–100.0)
Monocytes Absolute: 0.5 10*3/uL (ref 0.1–1.0)
Monocytes Relative: 6.3 % (ref 3.0–12.0)
Neutro Abs: 5.4 10*3/uL (ref 1.4–7.7)
Neutrophils Relative %: 61.6 % (ref 43.0–77.0)
Platelets: 300 10*3/uL (ref 150.0–400.0)
RBC: 4.13 Mil/uL (ref 3.87–5.11)
RDW: 12.7 % (ref 11.5–15.5)
WBC: 8.8 10*3/uL (ref 4.0–10.5)

## 2022-07-11 LAB — COMPREHENSIVE METABOLIC PANEL
ALT: 17 U/L (ref 0–35)
AST: 17 U/L (ref 0–37)
Albumin: 4.8 g/dL (ref 3.5–5.2)
Alkaline Phosphatase: 68 U/L (ref 39–117)
BUN: 12 mg/dL (ref 6–23)
CO2: 26 mEq/L (ref 19–32)
Calcium: 9.8 mg/dL (ref 8.4–10.5)
Chloride: 104 mEq/L (ref 96–112)
Creatinine, Ser: 0.67 mg/dL (ref 0.40–1.20)
GFR: 124.87 mL/min (ref >60.00)
Glucose, Bld: 85 mg/dL (ref 70–99)
Potassium: 4.2 mEq/L (ref 3.5–5.1)
Sodium: 141 mEq/L (ref 135–145)
Total Bilirubin: 1 mg/dL (ref 0.2–1.2)
Total Protein: 7.3 g/dL (ref 6.0–8.3)

## 2022-07-11 LAB — POCT URINE PREGNANCY: Preg Test, Ur: NEGATIVE

## 2022-07-11 LAB — D-DIMER, QUANTITATIVE: D-Dimer, Quant: 0.33 mcg/mL FEU (ref <0.50)

## 2022-07-11 LAB — TSH: TSH: 2.45 u[IU]/mL (ref 0.35–5.50)

## 2022-07-11 NOTE — Progress Notes (Signed)
Subjective:    Patient ID: Margaret Snow, female    DOB: 2000-09-01, 22 y.o.   MRN: [id number]  Chief Complaint  Patient presents with   Shortness of Breath    Started middle of last week  Denies any recent illnesses     HPI Patient is in today for shortness of breath concerns x 1 week.  Yesterday got out of breath going from shower to living room.  Also says she has been feeling really tired.  Feels like she's having some intermittent pain right side of chest.   Traveled to [facility] for a concert two weeks ago, about a 7 hour drive & then came back home next day. Stopped a few times, but a lot of time in the car. Has a prescription for OCP, but hasn't been taking regularly for awhile note.   No HA or fever. No chills. No leg pain or swelling. No fainting or near-syncope. No ST or cough. No n/v/d.  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    Past Surgical History:  Procedure Laterality Date   TYMPANOPLASTY  02/13/2012   Procedure: TYMPANOPLASTY;  Surgeon: Melissa Montane, MD;  Location: [facility];  Service: ENT;  Laterality: Right;  right tympanoplasty   TYMPANOSTOMY TUBE PLACEMENT  2004   TYMPANOSTOMY TUBE PLACEMENT      Family History  Problem Relation Age of Onset   Hypertension Maternal Grandmother    Hypertension Maternal Grandfather    Headache Maternal Grandfather    Cancer Maternal Grandfather    Hyperlipidemia Maternal Grandfather    Hypertension Paternal Grandfather    Alcohol abuse Paternal Grandfather    Cancer Paternal Grandfather    Depression Father    Migraines Maternal Uncle    Depression Maternal Uncle    Anxiety disorder Maternal Uncle    ADD / ADHD  Sister    Seizures Neg Hx    Bipolar disorder Neg Hx    Schizophrenia Neg Hx    Autism Neg Hx     Social History   Tobacco Use   Smoking status: Never   Smokeless tobacco: Never  Vaping Use   Vaping Use: Never used  Substance Use Topics   Alcohol use: No   Drug use: No     No Known Allergies  Review of Systems NEGATIVE UNLESS OTHERWISE INDICATED IN HPI      Objective:     BP 136/82   Pulse (!) 101   Temp (!) 97.5 F (36.4 C) (Temporal)   Resp 17   Ht 5' 3"$  (1.6 m)   Wt 173 lb 6.4 oz (78.7 kg)   SpO2 99%   BMI 30.72 kg/m   Wt Readings from Last 3 Encounters:  07/11/22 173 lb 6.4 oz (78.7 kg)  04/03/22 173 lb 6.4 oz (78.7 kg)  01/02/22 164 lb 3.2 oz (74.5 kg)    BP Readings from Last 3 Encounters:  07/11/22 136/82  04/03/22 115/78  01/02/22 117/76     Physical Exam Vitals and nursing note reviewed.  Constitutional:      Appearance: Normal appearance. She is normal weight. She is not  toxic-appearing.  HENT:     Head: Normocephalic and atraumatic.     Right Ear: Tympanic membrane, ear canal and external ear normal.     Left Ear: Tympanic membrane, ear canal and external ear normal.     Nose: Nose normal.     Mouth/Throat:     Mouth: Mucous membranes are moist.  Eyes:     Extraocular Movements: Extraocular movements intact.     Conjunctiva/sclera: Conjunctivae normal.     Pupils: Pupils are equal, round, and reactive to light.  Cardiovascular:     Rate and Rhythm: Regular rhythm. Tachycardia present.     Pulses: Normal pulses.     Heart sounds: Normal heart sounds.  Pulmonary:     Effort: Pulmonary effort is normal.     Breath sounds: Normal breath sounds.     Comments: No signs of respiratory distress or SOB. Pt denies any symptoms in office.  Abdominal:     General: Abdomen is flat. Bowel sounds are normal.     Palpations: Abdomen is soft.  Musculoskeletal:        General: Normal range of motion.     Cervical back: Normal range of motion  and neck supple.  Skin:    General: Skin is warm and dry.  Neurological:     General: No focal deficit present.     Mental Status: She is alert and oriented to person, place, and time.  Psychiatric:        Mood and Affect: Mood is anxious (somewhat anxious).        Behavior: Behavior normal.        Thought Content: Thought content normal.        Judgment: Judgment normal.        Assessment & Plan:  Shortness of breath -     D-dimer, quantitative -     CBC with Differential/Platelet -     Comprehensive metabolic panel -     TSH -     POCT urine pregnancy  Other fatigue -     D-dimer, quantitative -     CBC with Differential/Platelet -     Comprehensive metabolic panel -     TSH -     POCT urine pregnancy   No red flags on exam. No symptoms in office. She walked around the office with pulse ox, and saturation stayed normal entire time walking; she stayed asymptomatic. UPT was negative. Because of week long symptoms, will check labs today. If D-dimer positive, will need CTA chest. Slight tachycardia on exam, likely due to anxiety. I think overall symptoms are from deconditioning and starting back to work full-time. ED precautions discussed. Pt understanding and agreeable.     Return if symptoms worsen or fail to improve.  This note was prepared with assistance of Systems analyst. Occasional wrong-word or sound-a-like substitutions may have occurred due to the inherent limitations of voice recognition software.  Time Spent: 37 minutes of total time was spent on the date of the encounter performing the following actions: chart review prior to seeing the patient, obtaining history, performing a medically necessary exam, counseling on the treatment plan, placing orders, and documenting in our EHR.       Cambelle Suchecki M Andrw Mcguirt, PA-C

## 2022-07-14 NOTE — Patient Instructions (Signed)
Good to see you today. Will check labs, treat pending abnormal results.   Try to stay well hydrated and rest this weekend.  If acutely worse symptoms or any new symptoms such as chest pain or dizziness arise, go to the nearest emergency dept.

## 2022-07-18 ENCOUNTER — Ambulatory Visit: Payer: Commercial Managed Care - PPO | Admitting: Physician Assistant

## 2022-07-19 ENCOUNTER — Other Ambulatory Visit (HOSPITAL_COMMUNITY): Payer: Self-pay

## 2022-07-19 MED ORDER — IPRATROPIUM BROMIDE 0.06 % NA SOLN
2.0000 | Freq: Four times a day (QID) | NASAL | 0 refills | Status: DC | PRN
  Filled 2022-07-19: qty 15, 19d supply, fill #0

## 2022-07-19 MED ORDER — ALBUTEROL SULFATE HFA 108 (90 BASE) MCG/ACT IN AERS
2.0000 | INHALATION_SPRAY | RESPIRATORY_TRACT | 0 refills | Status: DC | PRN
  Filled 2022-07-19: qty 6.7, 17d supply, fill #0

## 2022-07-22 ENCOUNTER — Ambulatory Visit: Payer: Commercial Managed Care - PPO | Admitting: Physician Assistant

## 2022-10-02 ENCOUNTER — Ambulatory Visit: Payer: Commercial Managed Care - PPO | Admitting: Physician Assistant

## 2022-11-28 ENCOUNTER — Other Ambulatory Visit (HOSPITAL_COMMUNITY): Payer: Self-pay

## 2022-11-28 ENCOUNTER — Ambulatory Visit: Payer: Commercial Managed Care - PPO | Admitting: Physician Assistant

## 2022-11-28 ENCOUNTER — Encounter: Payer: Self-pay | Admitting: Physician Assistant

## 2022-11-28 ENCOUNTER — Other Ambulatory Visit: Payer: Self-pay

## 2022-11-28 VITALS — BP 113/79 | HR 70 | Temp 97.5°F | Ht 63.0 in | Wt 174.0 lb

## 2022-11-28 DIAGNOSIS — F419 Anxiety disorder, unspecified: Secondary | ICD-10-CM | POA: Diagnosis not present

## 2022-11-28 DIAGNOSIS — F32A Depression, unspecified: Secondary | ICD-10-CM

## 2022-11-28 MED ORDER — BUPROPION HCL ER (XL) 300 MG PO TB24
300.0000 mg | ORAL_TABLET | Freq: Every day | ORAL | 3 refills | Status: DC
  Filled 2022-11-28: qty 90, 90d supply, fill #0
  Filled 2023-03-06: qty 90, 90d supply, fill #1
  Filled 2023-06-13 – 2023-07-02 (×2): qty 90, 90d supply, fill #2
  Filled 2023-10-10 – 2023-10-13 (×2): qty 90, 90d supply, fill #3

## 2022-11-28 NOTE — Patient Instructions (Signed)
Good to see you today!  Continue on Wellbutrin daily   Please try these tips to maintain a healthy lifestyle:  Eat most of your calories during the day when you are active. Eliminate processed foods including packaged sweets (pies, cakes, cookies), reduce intake of potatoes, white bread, white pasta, and white rice. Look for whole grain options, oat flour or almond flour.  Each meal should contain half fruits/vegetables, one quarter protein, and one quarter carbs (no bigger than a computer mouse).  Cut down on sweet beverages. This includes juice, soda, and sweet tea. Also watch fruit intake, though this is a healthier sweet option, it still contains natural sugar! Limit to 3 servings daily.  Drink at least 1 glass of water with each meal and aim for at least 8 glasses (64 ounces) per day.  Exercise at least 150 minutes every week to the best of your ability.    Take Care,  Ta Fair, PA-C

## 2022-11-28 NOTE — Assessment & Plan Note (Signed)
Stable, doing well with Wellbutrin XL 300 mg daily Working full-time in daycare with toddlers Exercising, doing well overall F/up in 1 year or prn

## 2022-11-28 NOTE — Progress Notes (Signed)
Subjective:    Patient ID: Margaret Snow, female    DOB: 07-16-2000, 22 y.o.   MRN: [id number]  Chief Complaint  Patient presents with   Medication Refill    Needing refill on medication , she is out of her medication , she took he last one this morning , no other concerns     HPI Patient is in today for medication refill. See A/P.   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    Past Surgical History:  Procedure Laterality Date   TYMPANOPLASTY  02/13/2012   Procedure: TYMPANOPLASTY;  Surgeon: Suzanna Obey, MD;  Location: [facility];  Service: ENT;  Laterality: Right;  right tympanoplasty   TYMPANOSTOMY TUBE PLACEMENT  2004   TYMPANOSTOMY TUBE PLACEMENT      Family History  Problem Relation Age of Onset   Hypertension Maternal Grandmother    Hypertension Maternal Grandfather    Headache Maternal Grandfather    Cancer Maternal Grandfather    Hyperlipidemia Maternal Grandfather    Hypertension Paternal Grandfather    Alcohol abuse Paternal Grandfather    Cancer Paternal Grandfather    Depression Father    Migraines Maternal Uncle    Depression Maternal Uncle    Anxiety disorder Maternal Uncle    ADD / ADHD Sister    Seizures Neg Hx    Bipolar disorder Neg Hx    Schizophrenia Neg Hx    Autism Neg Hx     Social History   Tobacco Use   Smoking status: Never   Smokeless tobacco: Never  Vaping Use   Vaping Use: Never used  Substance Use Topics   Alcohol use: No   Drug use: No     No Known Allergies  Review of Systems NEGATIVE UNLESS OTHERWISE INDICATED IN HPI      Objective:     BP 113/79   Pulse 70   Temp (!) 97.5 F (36.4 C)   Ht 5\' 3"  (1.6 m)   Wt 174 lb (78.9 kg)   LMP  (LMP Unknown)   SpO2 100%   BMI 30.82 kg/m   Wt Readings from Last 3 Encounters:  11/28/22 174 lb (78.9 kg)  07/11/22 173 lb 6.4 oz  (78.7 kg)  04/03/22 173 lb 6.4 oz (78.7 kg)    BP Readings from Last 3 Encounters:  11/28/22 113/79  07/11/22 136/82  04/03/22 115/78     Physical Exam Vitals and nursing note reviewed.  Constitutional:      Appearance: Normal appearance.  Cardiovascular:     Rate and Rhythm: Normal rate and regular rhythm.  Pulmonary:     Effort: Pulmonary effort is normal.     Breath sounds: Normal breath sounds.  Neurological:     Mental Status: She is alert.  Psychiatric:        Mood and Affect: Mood normal.        Behavior: Behavior normal.        Assessment & Plan:  Anxiety and depression Assessment & Plan: Stable, doing well with Wellbutrin XL 300 mg daily Working full-time in daycare with toddlers Exercising, doing well overall F/up in 1 year or prn   Orders: -     buPROPion HCl ER (XL); Take 1 tablet (300 mg total) by mouth daily.  Dispense: 90 tablet; Refill:  3        Return in about 1 year (around 11/28/2023) for recheck/follow-up.    Dezzie Badilla M Tuwanna Krausz, PA-C

## 2023-03-06 ENCOUNTER — Encounter: Payer: Self-pay | Admitting: Pharmacist

## 2023-03-06 ENCOUNTER — Other Ambulatory Visit (HOSPITAL_COMMUNITY): Payer: Self-pay

## 2023-03-06 ENCOUNTER — Other Ambulatory Visit: Payer: Self-pay

## 2023-04-30 DIAGNOSIS — Z01419 Encounter for gynecological examination (general) (routine) without abnormal findings: Secondary | ICD-10-CM | POA: Diagnosis not present

## 2023-04-30 DIAGNOSIS — Z6827 Body mass index (BMI) 27.0-27.9, adult: Secondary | ICD-10-CM | POA: Diagnosis not present

## 2023-04-30 DIAGNOSIS — Z309 Encounter for contraceptive management, unspecified: Secondary | ICD-10-CM | POA: Diagnosis not present

## 2023-04-30 DIAGNOSIS — Z113 Encounter for screening for infections with a predominantly sexual mode of transmission: Secondary | ICD-10-CM | POA: Diagnosis not present

## 2023-05-23 DIAGNOSIS — Z30430 Encounter for insertion of intrauterine contraceptive device: Secondary | ICD-10-CM | POA: Diagnosis not present

## 2023-05-23 DIAGNOSIS — Z3202 Encounter for pregnancy test, result negative: Secondary | ICD-10-CM | POA: Diagnosis not present

## 2023-06-13 ENCOUNTER — Other Ambulatory Visit: Payer: Self-pay

## 2023-06-13 ENCOUNTER — Encounter: Payer: Self-pay | Admitting: Pharmacist

## 2023-06-13 ENCOUNTER — Other Ambulatory Visit (HOSPITAL_COMMUNITY): Payer: Self-pay

## 2023-06-17 ENCOUNTER — Other Ambulatory Visit: Payer: Self-pay

## 2023-06-23 DIAGNOSIS — H52223 Regular astigmatism, bilateral: Secondary | ICD-10-CM | POA: Diagnosis not present

## 2023-07-02 ENCOUNTER — Other Ambulatory Visit (HOSPITAL_COMMUNITY): Payer: Self-pay

## 2023-07-24 ENCOUNTER — Ambulatory Visit: Payer: Commercial Managed Care - PPO | Admitting: Physician Assistant

## 2023-08-04 ENCOUNTER — Ambulatory Visit: Payer: Commercial Managed Care - PPO | Admitting: Physician Assistant

## 2023-08-04 VITALS — BP 110/64 | HR 74 | Temp 97.9°F | Ht 63.0 in | Wt 146.8 lb

## 2023-08-04 DIAGNOSIS — B079 Viral wart, unspecified: Secondary | ICD-10-CM | POA: Diagnosis not present

## 2023-08-04 NOTE — Progress Notes (Signed)
 Patient ID: Margaret Snow, female    DOB: 2001-03-23, 23 y.o.   MRN: [id number]   Assessment & Plan:  Viral warts, unspecified type   Assessment and Plan    Cutaneous Wart Persistent despite over-the-counter treatments. No other skin concerns or history of viral warts. -Performed cryotherapy today with three cycles of liquid nitrogen. -Schedule follow-up in 2-3 weeks for possible repeat cryotherapy. -If unresponsive after 1-2 treatments, consider shave biopsy    R anterior knee lesion: Procedure explained, consent obtained. Cryotherapy performed on 1 lesion. Three freeze-thaw cycles with ice ring formation and deformation in between each cycle. Pt tolerated procedure well. Aftercare instructions provided.      Return in about 2 weeks (around 08/18/2023) for Cryo wart recheck .    Subjective:    Chief Complaint  Patient presents with   Warts    Has a wart on right knee. Pt states not painful wart has been there for year.    HPI Discussed the use of AI scribe software for clinical note transcription with the patient, who gave verbal consent to proceed.  History of Present Illness   Margaret Snow is a 23 year old female who presents with a persistent wart on R knee.  She has had a persistent wart for approximately one year. Despite using over-the-counter treatments, including topical applications and freezing products, the wart has not decreased in size. She occasionally gets warts, but they typically resolve with treatment, unlike this one. Sometimes itches.   No history of viral warts in other locations such as the mouth or genital area. She has not noticed any new skin spots that are concerning to her.   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    Past Surgical History:  Procedure Laterality Date   TYMPANOPLASTY  02/13/2012    Procedure: TYMPANOPLASTY;  Surgeon: Suzanna Obey, MD;  Location: [facility];  Service: ENT;  Laterality: Right;  right tympanoplasty   TYMPANOSTOMY TUBE PLACEMENT  2004   TYMPANOSTOMY TUBE PLACEMENT      Family History  Problem Relation Age of Onset   Hypertension Maternal Grandmother    Hypertension Maternal Grandfather    Headache Maternal Grandfather    Cancer Maternal Grandfather    Hyperlipidemia Maternal Grandfather    Hypertension Paternal Grandfather    Alcohol abuse Paternal Grandfather    Cancer Paternal Grandfather    Depression Father    Migraines Maternal Uncle    Depression Maternal Uncle    Anxiety disorder Maternal Uncle    ADD / ADHD Sister    Seizures Neg Hx    Bipolar disorder Neg Hx    Schizophrenia Neg Hx    Autism Neg Hx     Social History   Tobacco Use   Smoking status: Never   Smokeless tobacco: Never  Vaping Use   Vaping status: Never Used  Substance Use Topics   Alcohol use: No   Drug use: No     No Known Allergies  Review of Systems NEGATIVE UNLESS OTHERWISE INDICATED IN HPI      Objective:     BP 110/64   Pulse 74   Temp 97.9 F (36.6 C) (Temporal)   Ht 5\' 3"  (1.6 m)   Wt 146 lb 12.8 oz (66.6 kg)   SpO2 95%  BMI 26.00 kg/m   Wt Readings from Last 3 Encounters:  08/04/23 146 lb 12.8 oz (66.6 kg)  11/28/22 174 lb (78.9 kg)  07/11/22 173 lb 6.4 oz (78.7 kg)    BP Readings from Last 3 Encounters:  08/04/23 110/64  11/28/22 113/79  07/11/22 136/82     Physical Exam Vitals and nursing note reviewed.  Constitutional:      Appearance: Normal appearance.  Skin:    Findings: Lesion (slightly raised oval warty lesion R anterior knee) present.  Neurological:     Mental Status: She is alert.  Psychiatric:        Mood and Affect: Mood normal.        Behavior: Behavior normal.        Margaret Snow M Jerrald Doverspike, PA-C

## 2023-08-18 ENCOUNTER — Ambulatory Visit: Admitting: Physician Assistant

## 2023-10-10 ENCOUNTER — Encounter: Payer: Self-pay | Admitting: Pharmacist

## 2023-10-10 ENCOUNTER — Other Ambulatory Visit (HOSPITAL_COMMUNITY): Payer: Self-pay

## 2023-10-10 ENCOUNTER — Other Ambulatory Visit: Payer: Self-pay

## 2023-10-13 ENCOUNTER — Other Ambulatory Visit (HOSPITAL_COMMUNITY): Payer: Self-pay

## 2023-10-14 ENCOUNTER — Other Ambulatory Visit: Payer: Self-pay

## 2023-10-15 ENCOUNTER — Other Ambulatory Visit: Payer: Self-pay

## 2023-12-01 ENCOUNTER — Ambulatory Visit: Payer: Commercial Managed Care - PPO | Admitting: Physician Assistant

## 2023-12-19 DIAGNOSIS — Z202 Contact with and (suspected) exposure to infections with a predominantly sexual mode of transmission: Secondary | ICD-10-CM | POA: Diagnosis not present

## 2023-12-30 ENCOUNTER — Other Ambulatory Visit (HOSPITAL_COMMUNITY): Payer: Self-pay

## 2023-12-30 ENCOUNTER — Telehealth: Admitting: Physician Assistant

## 2023-12-30 DIAGNOSIS — R3989 Other symptoms and signs involving the genitourinary system: Secondary | ICD-10-CM | POA: Diagnosis not present

## 2023-12-30 MED ORDER — CEPHALEXIN 500 MG PO CAPS
500.0000 mg | ORAL_CAPSULE | Freq: Two times a day (BID) | ORAL | 0 refills | Status: AC
  Filled 2023-12-30: qty 14, 7d supply, fill #0

## 2023-12-30 NOTE — Progress Notes (Signed)
 I have spent 5 minutes in review of e-visit questionnaire, review and updating patient chart, medical decision making and response to patient.   Piedad Climes, PA-C

## 2023-12-30 NOTE — Progress Notes (Signed)

## 2024-01-09 ENCOUNTER — Encounter: Payer: Self-pay | Admitting: Physician Assistant

## 2024-01-09 ENCOUNTER — Other Ambulatory Visit: Payer: Self-pay

## 2024-01-09 ENCOUNTER — Other Ambulatory Visit (HOSPITAL_COMMUNITY)
Admission: RE | Admit: 2024-01-09 | Discharge: 2024-01-09 | Disposition: A | Discharge status: Home / Self Care | Source: Ambulatory Visit | Attending: Physician Assistant | Admitting: Physician Assistant

## 2024-01-09 ENCOUNTER — Ambulatory Visit: Admitting: Physician Assistant

## 2024-01-09 ENCOUNTER — Other Ambulatory Visit (HOSPITAL_COMMUNITY): Payer: Self-pay

## 2024-01-09 VITALS — BP 118/72 | HR 84 | Temp 98.4°F | Ht 63.0 in | Wt 146.0 lb

## 2024-01-09 DIAGNOSIS — N760 Acute vaginitis: Secondary | ICD-10-CM

## 2024-01-09 DIAGNOSIS — F419 Anxiety disorder, unspecified: Secondary | ICD-10-CM

## 2024-01-09 DIAGNOSIS — B9689 Other specified bacterial agents as the cause of diseases classified elsewhere: Secondary | ICD-10-CM | POA: Diagnosis not present

## 2024-01-09 DIAGNOSIS — F32A Depression, unspecified: Secondary | ICD-10-CM | POA: Diagnosis not present

## 2024-01-09 MED ORDER — BUPROPION HCL ER (XL) 300 MG PO TB24
300.0000 mg | ORAL_TABLET | Freq: Every day | ORAL | 1 refills | Status: AC
  Filled 2024-01-09 – 2024-02-27 (×2): qty 90, 90d supply, fill #0
  Filled 2024-07-02 (×2): qty 90, 90d supply, fill #1

## 2024-01-09 NOTE — Progress Notes (Signed)
 Patient ID: Margaret Snow, female    DOB: 08-24-2000, 23 y.o.   MRN: [id number]   Assessment & Plan:  Anxiety and depression -     buPROPion  HCl ER (XL); Take 1 tablet (300 mg total) by mouth daily.  Dispense: 90 tablet; Refill: 1  Bacterial vaginosis      Assessment and Plan Assessment & Plan Bacterial vaginosis Bacterial vaginosis was diagnosed at a [facility] visit on July 18th. Completed a course of oral metronidazole (Flagyl) approximately a week ago. Reports improvement in discharge but desires confirmation of resolution. No bleeding or itching reported, reducing the likelihood of a concurrent yeast infection. - Perform self-swab to check for resolution of bacterial vaginosis - Communicate results and further steps if necessary  Major depressive disorder Currently on Wellbutrin  XL 300 mg daily. Reports feeling functional but experiencing a lack of motivation and interest in activities, such as reading. Symptoms may be influenced by recent weather changes. No significant changes in life circumstances noted. Has not yet engaged in counseling but expresses interest. - Prescribe Wellbutrin  XL 300 mg with a 90-day supply and a refill - Encourage follow-up with counseling services, provide contact information for a counselor in [place] and the [employer] - Monitor symptoms over the next month or two and consider medication adjustment if symptoms persist - Schedule follow-up appointment in six months      Return in about 6 months (around 07/11/2024) for recheck/follow-up.    Subjective:    Chief Complaint  Patient presents with   Vaginal Discharge    Pt was having vaginal discharge and [facility] BV, pt wanting to retest to ensure antibiotics worked and all is cleared up, still having some discharge but much better.     Vaginal Discharge The patient's primary symptoms include vaginal discharge.   Discussed the use of AI  scribe software for clinical note transcription with the patient, who gave verbal consent to proceed.  History of Present Illness Margaret Snow is a 23 year old female who presents with concerns about vaginal discharge and follow-up for bacterial vaginosis treatment.  She experienced unusual vaginal discharge, prompting evaluation at a [facility] on July 18th. Screening was negative for STDs but positive for bacterial vaginosis. She completed a course of oral metronidazole approximately one week ago, resulting in an improvement in her discharge. No bleeding or itching is present, and she wishes to confirm the resolution of BV.  She uses a Mirena  IUD and does not experience menstrual cycles. She believes her Pap smear is up to date and recalls being told the next one is due next year.  She is taking Wellbutrin  XL 300 mg daily. She notes a lack of motivation and a 'blah' feeling over the past few weeks, despite continuing her daily activities. She has not been reading as much as she used to, attributing this to a lack of motivation. No pattern of mood changes with seasonal changes is noted. She intends to contact a counselor but has not yet done so.  No new sexual partners and uses protection with her current partner. No symptoms of yeast infection.     Past Medical History:  Diagnosis Date   Anxiety    Depression    Hearing loss on right 02/02/2012   due to TM perforation   Tooth loose    x 1   Tympanic membrane perforation 02/02/2012   right    Past Surgical History:  Procedure Laterality Date   TYMPANOPLASTY  02/13/2012   Procedure: TYMPANOPLASTY;  Surgeon: Norleen Notice, MD;  Location: [facility];  Service: ENT;  Laterality: Right;  right tympanoplasty   TYMPANOSTOMY TUBE PLACEMENT  2004   TYMPANOSTOMY TUBE PLACEMENT      Family History  Problem Relation Age of Onset   Hypertension Maternal Grandmother    Hypertension Maternal Grandfather    Headache Maternal  Grandfather    Cancer Maternal Grandfather    Hyperlipidemia Maternal Grandfather    Hypertension Paternal Grandfather    Alcohol abuse Paternal Grandfather    Cancer Paternal Grandfather    Depression Father    Migraines Maternal Uncle    Depression Maternal Uncle    Anxiety disorder Maternal Uncle    ADD / ADHD Sister    ADD / ADHD Sister    Seizures Neg Hx    Bipolar disorder Neg Hx    Schizophrenia Neg Hx    Autism Neg Hx     Social History   Tobacco Use   Smoking status: Never   Smokeless tobacco: Never  Vaping Use   Vaping status: Never Used  Substance Use Topics   Alcohol use: No   Drug use: No     No Known Allergies  Review of Systems  Genitourinary:  Positive for vaginal discharge.   NEGATIVE UNLESS OTHERWISE INDICATED IN HPI      Objective:     BP 118/72 (BP Location: Left Arm, Patient Position: Sitting, Cuff Size: Normal)   Pulse 84   Temp 98.4 F (36.9 C) (Temporal)   Ht 5' 3 (1.6 m)   Wt 146 lb (66.2 kg)   LMP  (LMP Unknown)   SpO2 99%   BMI 25.86 kg/m   Wt Readings from Last 3 Encounters:  01/09/24 146 lb (66.2 kg)  08/04/23 146 lb 12.8 oz (66.6 kg)  11/28/22 174 lb (78.9 kg)    BP Readings from Last 3 Encounters:  01/09/24 118/72  08/04/23 110/64  11/28/22 113/79     Physical Exam Vitals and nursing note reviewed.  Constitutional:      Appearance: Normal appearance.  Eyes:     Extraocular Movements: Extraocular movements intact.     Conjunctiva/sclera: Conjunctivae normal.     Pupils: Pupils are equal, round, and reactive to light.  Cardiovascular:     Rate and Rhythm: Normal rate and regular rhythm.  Pulmonary:     Effort: Pulmonary effort is normal.  Neurological:     General: No focal deficit present.     Mental Status: She is alert.  Psychiatric:        Mood and Affect: Mood normal.             Margaret Vannest M Shellyann Wandrey, PA-C

## 2024-01-09 NOTE — Addendum Note (Signed)
 Addended by: Gracianna Vink on: 01/09/2024 11:10 AM   Modules accepted: Orders

## 2024-01-12 ENCOUNTER — Encounter: Payer: Self-pay | Admitting: Pharmacist

## 2024-01-12 ENCOUNTER — Ambulatory Visit: Payer: Self-pay | Admitting: Physician Assistant

## 2024-01-12 ENCOUNTER — Other Ambulatory Visit: Payer: Self-pay

## 2024-01-12 LAB — CERVICOVAGINAL ANCILLARY ONLY
Bacterial Vaginitis (gardnerella): NEGATIVE
Candida Glabrata: NEGATIVE
Candida Vaginitis: NEGATIVE
Comment: NEGATIVE
Comment: NEGATIVE
Comment: NEGATIVE

## 2024-01-14 ENCOUNTER — Other Ambulatory Visit: Payer: Self-pay

## 2024-02-01 ENCOUNTER — Telehealth: Admitting: Family

## 2024-02-01 DIAGNOSIS — N898 Other specified noninflammatory disorders of vagina: Secondary | ICD-10-CM

## 2024-02-01 NOTE — Progress Notes (Signed)
  Because of your recurrent vaginal discharge, I feel your condition warrants further evaluation and I recommend that you be seen in a face-to-face visit.   NOTE: There will be NO CHARGE for this E-Visit   If you are having a true medical emergency, please call 911.     For an urgent face to face visit, [facility] has multiple urgent care centers for your convenience.  Click the link below for the full list of locations and hours, walk-in wait times, appointment scheduling options and driving directions:  Urgent Care - [place], [place], [place], [place], [place], [place]  [place]     Your MyChart E-visit questionnaire answers were reviewed by a board certified advanced clinical practitioner to complete your personal care plan based on your specific symptoms.    Thank you for using e-Visits.

## 2024-02-12 ENCOUNTER — Ambulatory Visit: Admitting: Physician Assistant

## 2024-02-18 DIAGNOSIS — F4323 Adjustment disorder with mixed anxiety and depressed mood: Secondary | ICD-10-CM | POA: Diagnosis not present

## 2024-02-25 DIAGNOSIS — F4323 Adjustment disorder with mixed anxiety and depressed mood: Secondary | ICD-10-CM | POA: Diagnosis not present

## 2024-02-27 ENCOUNTER — Other Ambulatory Visit: Payer: Self-pay

## 2024-02-27 ENCOUNTER — Other Ambulatory Visit (HOSPITAL_COMMUNITY): Payer: Self-pay

## 2024-03-03 DIAGNOSIS — F4323 Adjustment disorder with mixed anxiety and depressed mood: Secondary | ICD-10-CM | POA: Diagnosis not present

## 2024-03-10 DIAGNOSIS — F4323 Adjustment disorder with mixed anxiety and depressed mood: Secondary | ICD-10-CM | POA: Diagnosis not present

## 2024-03-17 DIAGNOSIS — F4323 Adjustment disorder with mixed anxiety and depressed mood: Secondary | ICD-10-CM | POA: Diagnosis not present

## 2024-03-25 DIAGNOSIS — F4323 Adjustment disorder with mixed anxiety and depressed mood: Secondary | ICD-10-CM | POA: Diagnosis not present

## 2024-04-07 DIAGNOSIS — F4323 Adjustment disorder with mixed anxiety and depressed mood: Secondary | ICD-10-CM | POA: Diagnosis not present

## 2024-04-14 DIAGNOSIS — F4323 Adjustment disorder with mixed anxiety and depressed mood: Secondary | ICD-10-CM | POA: Diagnosis not present

## 2024-04-21 DIAGNOSIS — F4323 Adjustment disorder with mixed anxiety and depressed mood: Secondary | ICD-10-CM | POA: Diagnosis not present

## 2024-04-27 DIAGNOSIS — F4323 Adjustment disorder with mixed anxiety and depressed mood: Secondary | ICD-10-CM | POA: Diagnosis not present

## 2024-05-05 DIAGNOSIS — F4323 Adjustment disorder with mixed anxiety and depressed mood: Secondary | ICD-10-CM | POA: Diagnosis not present

## 2024-05-12 DIAGNOSIS — F4323 Adjustment disorder with mixed anxiety and depressed mood: Secondary | ICD-10-CM | POA: Diagnosis not present

## 2024-05-19 DIAGNOSIS — F4323 Adjustment disorder with mixed anxiety and depressed mood: Secondary | ICD-10-CM | POA: Diagnosis not present

## 2024-06-14 ENCOUNTER — Ambulatory Visit: Admitting: Physician Assistant

## 2024-07-02 ENCOUNTER — Other Ambulatory Visit: Payer: Self-pay

## 2024-07-02 ENCOUNTER — Other Ambulatory Visit (HOSPITAL_COMMUNITY): Payer: Self-pay

## 2024-07-02 ENCOUNTER — Ambulatory Visit: Admitting: Physician Assistant

## 2024-07-05 ENCOUNTER — Other Ambulatory Visit: Payer: Self-pay

## 2024-07-07 ENCOUNTER — Other Ambulatory Visit (HOSPITAL_COMMUNITY): Payer: Self-pay

## 2024-07-07 MED ORDER — ESCITALOPRAM OXALATE 10 MG PO TABS
10.0000 mg | ORAL_TABLET | Freq: Every day | ORAL | 0 refills | Status: AC
  Filled 2024-07-07: qty 30, 30d supply, fill #0

## 2024-07-13 ENCOUNTER — Ambulatory Visit: Admitting: Physician Assistant

## 2024-07-19 ENCOUNTER — Ambulatory Visit: Admitting: Physician Assistant
